# Patient Record
Sex: Female | Born: 1935 | Race: White | Hispanic: No | Smoking: Never smoker
Health system: Southern US, Community
[De-identification: ages and names within clinical notes are randomized; demographics above are authoritative.]

## PROBLEM LIST (undated history)

## (undated) DIAGNOSIS — K219 Gastro-esophageal reflux disease without esophagitis: Secondary | ICD-10-CM

## (undated) DIAGNOSIS — IMO0002 Reserved for concepts with insufficient information to code with codable children: Secondary | ICD-10-CM

## (undated) DIAGNOSIS — T7840XA Allergy, unspecified, initial encounter: Secondary | ICD-10-CM

## (undated) DIAGNOSIS — Z87440 Personal history of urinary (tract) infections: Secondary | ICD-10-CM

## (undated) DIAGNOSIS — G4700 Insomnia, unspecified: Secondary | ICD-10-CM

## (undated) DIAGNOSIS — F329 Major depressive disorder, single episode, unspecified: Secondary | ICD-10-CM

## (undated) DIAGNOSIS — M1990 Unspecified osteoarthritis, unspecified site: Secondary | ICD-10-CM

## (undated) DIAGNOSIS — F32A Depression, unspecified: Secondary | ICD-10-CM

## (undated) DIAGNOSIS — J449 Chronic obstructive pulmonary disease, unspecified: Secondary | ICD-10-CM

## (undated) DIAGNOSIS — I10 Essential (primary) hypertension: Secondary | ICD-10-CM

## (undated) HISTORY — DX: Unspecified osteoarthritis, unspecified site: M19.90

## (undated) HISTORY — DX: Major depressive disorder, single episode, unspecified: F32.9

## (undated) HISTORY — DX: Gastro-esophageal reflux disease without esophagitis: K21.9

## (undated) HISTORY — DX: Insomnia, unspecified: G47.00

## (undated) HISTORY — DX: Depression, unspecified: F32.A

## (undated) HISTORY — DX: Personal history of urinary (tract) infections: Z87.440

## (undated) HISTORY — DX: Essential (primary) hypertension: I10

## (undated) HISTORY — DX: Reserved for concepts with insufficient information to code with codable children: IMO0002

## (undated) HISTORY — DX: Allergy, unspecified, initial encounter: T78.40XA

## (undated) HISTORY — DX: Chronic obstructive pulmonary disease, unspecified: J44.9

## (undated) HISTORY — PX: ABDOMINAL HYSTERECTOMY: SHX81

---

## 1998-01-17 ENCOUNTER — Ambulatory Visit (HOSPITAL_COMMUNITY): Admission: RE | Admit: 1998-01-17 | Discharge: 1998-01-17 | Payer: Self-pay | Admitting: Family Medicine

## 2000-03-22 ENCOUNTER — Encounter: Payer: Self-pay | Admitting: Family Medicine

## 2000-03-22 ENCOUNTER — Ambulatory Visit (HOSPITAL_COMMUNITY): Admission: RE | Admit: 2000-03-22 | Discharge: 2000-03-22 | Payer: Self-pay | Admitting: Family Medicine

## 2001-10-22 ENCOUNTER — Other Ambulatory Visit: Admission: RE | Admit: 2001-10-22 | Discharge: 2001-10-22 | Payer: Self-pay | Admitting: Obstetrics and Gynecology

## 2004-07-26 ENCOUNTER — Ambulatory Visit: Payer: Self-pay | Admitting: Internal Medicine

## 2004-09-12 ENCOUNTER — Ambulatory Visit: Payer: Self-pay | Admitting: Internal Medicine

## 2004-10-18 ENCOUNTER — Ambulatory Visit: Payer: Self-pay | Admitting: Internal Medicine

## 2004-11-17 ENCOUNTER — Ambulatory Visit: Payer: Self-pay

## 2004-11-24 ENCOUNTER — Ambulatory Visit: Admission: RE | Admit: 2004-11-24 | Discharge: 2004-11-24 | Payer: Self-pay | Admitting: Internal Medicine

## 2004-11-27 ENCOUNTER — Ambulatory Visit: Payer: Self-pay | Admitting: Internal Medicine

## 2005-01-26 ENCOUNTER — Ambulatory Visit: Payer: Self-pay | Admitting: Internal Medicine

## 2005-05-22 ENCOUNTER — Ambulatory Visit: Payer: Self-pay | Admitting: Internal Medicine

## 2005-07-09 ENCOUNTER — Ambulatory Visit: Payer: Self-pay | Admitting: Internal Medicine

## 2005-07-17 ENCOUNTER — Ambulatory Visit: Payer: Self-pay | Admitting: Internal Medicine

## 2005-09-17 ENCOUNTER — Ambulatory Visit: Payer: Self-pay | Admitting: Internal Medicine

## 2005-10-15 ENCOUNTER — Ambulatory Visit: Payer: Self-pay | Admitting: Family Medicine

## 2005-11-12 ENCOUNTER — Ambulatory Visit: Payer: Self-pay | Admitting: Family Medicine

## 2006-01-23 ENCOUNTER — Ambulatory Visit: Payer: Self-pay | Admitting: Family Medicine

## 2006-06-26 ENCOUNTER — Ambulatory Visit: Payer: Self-pay | Admitting: Internal Medicine

## 2006-07-04 ENCOUNTER — Ambulatory Visit: Payer: Self-pay | Admitting: Family Medicine

## 2006-09-09 ENCOUNTER — Ambulatory Visit: Payer: Self-pay | Admitting: Family Medicine

## 2006-11-19 ENCOUNTER — Ambulatory Visit: Payer: Self-pay | Admitting: Family Medicine

## 2007-05-26 ENCOUNTER — Encounter: Payer: Self-pay | Admitting: Family Medicine

## 2007-07-01 DIAGNOSIS — J309 Allergic rhinitis, unspecified: Secondary | ICD-10-CM | POA: Insufficient documentation

## 2007-07-01 DIAGNOSIS — K219 Gastro-esophageal reflux disease without esophagitis: Secondary | ICD-10-CM | POA: Insufficient documentation

## 2007-07-01 DIAGNOSIS — I10 Essential (primary) hypertension: Secondary | ICD-10-CM | POA: Insufficient documentation

## 2007-07-01 DIAGNOSIS — M1990 Unspecified osteoarthritis, unspecified site: Secondary | ICD-10-CM | POA: Insufficient documentation

## 2007-07-01 DIAGNOSIS — J449 Chronic obstructive pulmonary disease, unspecified: Secondary | ICD-10-CM | POA: Insufficient documentation

## 2007-07-04 ENCOUNTER — Ambulatory Visit: Payer: Self-pay | Admitting: Family Medicine

## 2007-07-04 DIAGNOSIS — N3090 Cystitis, unspecified without hematuria: Secondary | ICD-10-CM | POA: Insufficient documentation

## 2007-07-04 LAB — CONVERTED CEMR LAB
Bilirubin Urine: NEGATIVE
Glucose, Urine, Semiquant: NEGATIVE
Ketones, urine, test strip: NEGATIVE
Nitrite: NEGATIVE
Protein, U semiquant: NEGATIVE
Specific Gravity, Urine: 1.015
Urobilinogen, UA: 0.2
pH: 5

## 2007-08-04 ENCOUNTER — Ambulatory Visit: Payer: Self-pay | Admitting: Family Medicine

## 2007-08-04 DIAGNOSIS — F329 Major depressive disorder, single episode, unspecified: Secondary | ICD-10-CM

## 2007-09-03 ENCOUNTER — Telehealth: Payer: Self-pay | Admitting: Family Medicine

## 2007-11-06 ENCOUNTER — Telehealth: Payer: Self-pay | Admitting: Family Medicine

## 2007-11-24 ENCOUNTER — Telehealth: Payer: Self-pay | Admitting: Family Medicine

## 2008-05-27 ENCOUNTER — Encounter: Payer: Self-pay | Admitting: Family Medicine

## 2008-06-16 ENCOUNTER — Ambulatory Visit: Payer: Self-pay | Admitting: Family Medicine

## 2008-06-16 DIAGNOSIS — R059 Cough, unspecified: Secondary | ICD-10-CM | POA: Insufficient documentation

## 2008-06-16 DIAGNOSIS — R05 Cough: Secondary | ICD-10-CM

## 2008-06-17 LAB — CONVERTED CEMR LAB
ALT: 18 units/L (ref 0–35)
AST: 19 units/L (ref 0–37)
Albumin: 3.7 g/dL (ref 3.5–5.2)
Alkaline Phosphatase: 53 units/L (ref 39–117)
BUN: 17 mg/dL (ref 6–23)
Basophils Absolute: 0.1 10*3/uL (ref 0.0–0.1)
Basophils Relative: 0.7 % (ref 0.0–3.0)
Bilirubin, Direct: 0.1 mg/dL (ref 0.0–0.3)
CO2: 31 meq/L (ref 19–32)
Calcium: 9.3 mg/dL (ref 8.4–10.5)
Chloride: 103 meq/L (ref 96–112)
Creatinine, Ser: 0.8 mg/dL (ref 0.4–1.2)
Eosinophils Absolute: 0.2 10*3/uL (ref 0.0–0.7)
Eosinophils Relative: 3.3 % (ref 0.0–5.0)
GFR calc Af Amer: 90 mL/min
GFR calc non Af Amer: 75 mL/min
Glucose, Bld: 117 mg/dL — ABNORMAL HIGH (ref 70–99)
HCT: 40.3 % (ref 36.0–46.0)
Hemoglobin: 13.8 g/dL (ref 12.0–15.0)
Lymphocytes Relative: 17.8 % (ref 12.0–46.0)
MCHC: 34.2 g/dL (ref 30.0–36.0)
MCV: 85.2 fL (ref 78.0–100.0)
Monocytes Absolute: 0.5 10*3/uL (ref 0.1–1.0)
Monocytes Relative: 7.5 % (ref 3.0–12.0)
Neutro Abs: 5.1 10*3/uL (ref 1.4–7.7)
Neutrophils Relative %: 70.7 % (ref 43.0–77.0)
Platelets: 221 10*3/uL (ref 150–400)
Potassium: 3.8 meq/L (ref 3.5–5.1)
RBC: 4.73 M/uL (ref 3.87–5.11)
RDW: 13.4 % (ref 11.5–14.6)
Sodium: 140 meq/L (ref 135–145)
TSH: 0.75 microintl units/mL (ref 0.35–5.50)
Total Bilirubin: 0.6 mg/dL (ref 0.3–1.2)
Total Protein: 6.7 g/dL (ref 6.0–8.3)
WBC: 7.2 10*3/uL (ref 4.5–10.5)

## 2008-06-29 ENCOUNTER — Ambulatory Visit: Payer: Self-pay | Admitting: Family Medicine

## 2008-06-29 DIAGNOSIS — R32 Unspecified urinary incontinence: Secondary | ICD-10-CM | POA: Insufficient documentation

## 2008-06-29 LAB — CONVERTED CEMR LAB
Bilirubin Urine: NEGATIVE
Glucose, Urine, Semiquant: NEGATIVE
Ketones, urine, test strip: NEGATIVE
Nitrite: NEGATIVE
Protein, U semiquant: NEGATIVE
Specific Gravity, Urine: <1.005
Urobilinogen, UA: 0.2
pH: 5

## 2008-07-07 ENCOUNTER — Encounter: Payer: Self-pay | Admitting: Family Medicine

## 2008-07-07 HISTORY — PX: COLONOSCOPY: SHX174

## 2008-10-15 ENCOUNTER — Ambulatory Visit: Payer: Self-pay | Admitting: Family Medicine

## 2008-10-15 LAB — CONVERTED CEMR LAB
Bilirubin Urine: NEGATIVE
Glucose, Urine, Semiquant: NEGATIVE
Ketones, urine, test strip: NEGATIVE
Nitrite: POSITIVE
Protein, U semiquant: NEGATIVE
Specific Gravity, Urine: <1.005
Urobilinogen, UA: 0.2
pH: 5.5

## 2008-11-10 ENCOUNTER — Encounter: Payer: Self-pay | Admitting: Family Medicine

## 2008-11-16 ENCOUNTER — Encounter: Payer: Self-pay | Admitting: Family Medicine

## 2008-12-21 ENCOUNTER — Encounter: Payer: Self-pay | Admitting: Family Medicine

## 2008-12-21 HISTORY — PX: CYSTOSCOPY: SUR368

## 2008-12-23 ENCOUNTER — Encounter (INDEPENDENT_AMBULATORY_CARE_PROVIDER_SITE_OTHER): Payer: Self-pay | Admitting: *Deleted

## 2008-12-27 ENCOUNTER — Telehealth: Payer: Self-pay | Admitting: Family Medicine

## 2009-08-11 ENCOUNTER — Ambulatory Visit: Payer: Self-pay | Admitting: Family Medicine

## 2009-08-11 DIAGNOSIS — G4700 Insomnia, unspecified: Secondary | ICD-10-CM | POA: Insufficient documentation

## 2009-08-12 ENCOUNTER — Encounter: Payer: Self-pay | Admitting: Family Medicine

## 2010-03-13 ENCOUNTER — Telehealth: Payer: Self-pay | Admitting: Family Medicine

## 2010-04-21 ENCOUNTER — Encounter: Payer: Self-pay | Admitting: Family Medicine

## 2010-06-14 ENCOUNTER — Encounter: Payer: Self-pay | Admitting: Family Medicine

## 2010-06-19 ENCOUNTER — Ambulatory Visit: Payer: Self-pay | Admitting: Family Medicine

## 2010-06-19 DIAGNOSIS — L259 Unspecified contact dermatitis, unspecified cause: Secondary | ICD-10-CM | POA: Insufficient documentation

## 2010-06-19 DIAGNOSIS — B029 Zoster without complications: Secondary | ICD-10-CM | POA: Insufficient documentation

## 2010-08-21 ENCOUNTER — Ambulatory Visit: Payer: Self-pay | Admitting: Family Medicine

## 2010-08-21 DIAGNOSIS — M4120 Other idiopathic scoliosis, site unspecified: Secondary | ICD-10-CM | POA: Insufficient documentation

## 2010-09-20 ENCOUNTER — Telehealth: Payer: Self-pay | Admitting: Family Medicine

## 2010-09-25 ENCOUNTER — Telehealth: Payer: Self-pay | Admitting: Family Medicine

## 2010-10-01 LAB — CONVERTED CEMR LAB
ALT: 15 units/L (ref 0–35)
ALT: 19 units/L (ref 0–35)
AST: 20 units/L (ref 0–37)
AST: 20 units/L (ref 0–37)
Albumin: 4 g/dL (ref 3.5–5.2)
Albumin: 4.1 g/dL (ref 3.5–5.2)
Alkaline Phosphatase: 49 units/L (ref 39–117)
Alkaline Phosphatase: 49 units/L (ref 39–117)
BUN: 15 mg/dL (ref 6–23)
BUN: 18 mg/dL (ref 6–23)
Basophils Absolute: 0 10*3/uL (ref 0.0–0.1)
Basophils Absolute: 0 10*3/uL (ref 0.0–0.1)
Basophils Relative: 0.1 % (ref 0.0–1.0)
Basophils Relative: 0.1 % (ref 0.0–3.0)
Bilirubin, Direct: 0 mg/dL (ref 0.0–0.3)
Bilirubin, Direct: 0.2 mg/dL (ref 0.0–0.3)
CO2: 29 meq/L (ref 19–32)
CO2: 31 meq/L (ref 19–32)
Calcium: 9.3 mg/dL (ref 8.4–10.5)
Calcium: 9.6 mg/dL (ref 8.4–10.5)
Chloride: 102 meq/L (ref 96–112)
Chloride: 102 meq/L (ref 96–112)
Cholesterol: 237 mg/dL — ABNORMAL HIGH (ref 0–200)
Cholesterol: 271 mg/dL (ref 0–200)
Creatinine, Ser: 0.9 mg/dL (ref 0.4–1.2)
Creatinine, Ser: 0.9 mg/dL (ref 0.4–1.2)
Direct LDL: 167.2 mg/dL
Direct LDL: 207.5 mg/dL
Eosinophils Absolute: 0.2 10*3/uL (ref 0.0–0.6)
Eosinophils Absolute: 0.2 10*3/uL (ref 0.0–0.7)
Eosinophils Relative: 3.4 % (ref 0.0–5.0)
Eosinophils Relative: 3.7 % (ref 0.0–5.0)
GFR calc Af Amer: 79 mL/min
GFR calc non Af Amer: 64.93 mL/min (ref >60)
GFR calc non Af Amer: 65 mL/min
Glucose, Bld: 102 mg/dL — ABNORMAL HIGH (ref 70–99)
Glucose, Bld: 114 mg/dL — ABNORMAL HIGH (ref 70–99)
HCT: 41.4 % (ref 36.0–46.0)
HCT: 42.5 % (ref 36.0–46.0)
HDL: 35.6 mg/dL — ABNORMAL LOW (ref >39.00)
HDL: 41.4 mg/dL (ref >39.0)
Hemoglobin: 14.1 g/dL (ref 12.0–15.0)
Hemoglobin: 14.4 g/dL (ref 12.0–15.0)
Lymphocytes Relative: 15.2 % (ref 12.0–46.0)
Lymphocytes Relative: 20.7 % (ref 12.0–46.0)
Lymphs Abs: 1 10*3/uL (ref 0.7–4.0)
MCHC: 33.1 g/dL (ref 30.0–36.0)
MCHC: 34.8 g/dL (ref 30.0–36.0)
MCV: 85.7 fL (ref 78.0–100.0)
MCV: 87.2 fL (ref 78.0–100.0)
Monocytes Absolute: 0.5 10*3/uL (ref 0.1–1.0)
Monocytes Absolute: 0.5 10*3/uL (ref 0.2–0.7)
Monocytes Relative: 7.7 % (ref 3.0–12.0)
Monocytes Relative: 9.1 % (ref 3.0–11.0)
Neutro Abs: 3.7 10*3/uL (ref 1.4–7.7)
Neutro Abs: 5 10*3/uL (ref 1.4–7.7)
Neutrophils Relative %: 66.7 % (ref 43.0–77.0)
Neutrophils Relative %: 73.3 % (ref 43.0–77.0)
Platelets: 235 10*3/uL (ref 150.0–400.0)
Platelets: 252 10*3/uL (ref 150–400)
Potassium: 3.9 meq/L (ref 3.5–5.1)
Potassium: 4.2 meq/L (ref 3.5–5.1)
RBC: 4.83 M/uL (ref 3.87–5.11)
RBC: 4.87 M/uL (ref 3.87–5.11)
RDW: 12.9 % (ref 11.5–14.6)
RDW: 13.2 % (ref 11.5–14.6)
Sodium: 141 meq/L (ref 135–145)
Sodium: 142 meq/L (ref 135–145)
TSH: 0.83 microintl units/mL (ref 0.35–5.50)
TSH: 3.46 microintl units/mL (ref 0.35–5.50)
Total Bilirubin: 0.8 mg/dL (ref 0.3–1.2)
Total Bilirubin: 0.9 mg/dL (ref 0.3–1.2)
Total CHOL/HDL Ratio: 6.5
Total CHOL/HDL Ratio: 7
Total Protein: 6.6 g/dL (ref 6.0–8.3)
Total Protein: 7.1 g/dL (ref 6.0–8.3)
Triglycerides: 147 mg/dL (ref 0–149)
Triglycerides: 190 mg/dL — ABNORMAL HIGH (ref 0.0–149.0)
VLDL: 29 mg/dL (ref 0–40)
VLDL: 38 mg/dL (ref 0.0–40.0)
WBC: 5.6 10*3/uL (ref 4.5–10.5)
WBC: 6.7 10*3/uL (ref 4.5–10.5)

## 2010-10-02 ENCOUNTER — Telehealth: Payer: Self-pay | Admitting: Family Medicine

## 2010-10-05 ENCOUNTER — Telehealth: Payer: Self-pay | Admitting: *Deleted

## 2010-10-05 NOTE — Letter (Signed)
Summary: [facility]  [facility]   Imported By: Maryln Gottron 05/01/2010 14:06:27  _____________________________________________________________________  External Attachment:    Type:   Image     Comment:   External Document

## 2010-10-05 NOTE — Progress Notes (Signed)
Summary: rx amlopidine   Phone Note From Pharmacy   Caller: [facility]* Summary of Call: refill amlodipine 5mg    Initial call taken by: Pura Spice, RN,  September 25, 2010 4:09 PM  Follow-up for Phone Call        faxed Follow-up by: Pura Spice, RN,  September 25, 2010 4:09 PM    New/Updated Medications: AMLODIPINE BESYLATE 5 MG TABS (AMLODIPINE BESYLATE) once daily needs to be seen Prescriptions: AMLODIPINE BESYLATE 5 MG TABS (AMLODIPINE BESYLATE) once daily needs to be seen  #30 x 2   Entered by:   Pura Spice, RN   Authorized by:   Nelwyn Salisbury MD   Signed by:   Pura Spice, RN on 09/25/2010   Method used:   Electronically to        [facility]* (retail)       [address] [place], [place]  [address]       Ph: [phone] or [phone]       Fax: [phone]   RxID:   [id number]

## 2010-10-05 NOTE — Progress Notes (Signed)
Summary: Pt req Lyrica to be changed to Gabapentin per pharmacist  Phone Note Call from Patient Call back at Home Phone [phone]   Caller: Patient Summary of Call: Pt said that her med needs to be changed from Lyrica to Gabapentin. Pt needs this done today asap.   Pt is req to be called asap today. Pls call in to [facility]. Pt is out of med.  Initial call taken by: Lucy Antigua,  September 20, 2010 11:55 AM  Follow-up for Phone Call        okay. stop Lyrica and call in Gabapentin 100 mg two times a day , #60 with 2 rf  Follow-up by: Nelwyn Salisbury MD,  September 20, 2010 5:22 PM  Additional Follow-up for Phone Call Additional follow up Details #1::        pt notified  med  called in  Additional Follow-up by: Pura Spice, RN,  September 21, 2010 8:36 AM    New/Updated Medications: GABAPENTIN 100 MG CAPS (GABAPENTIN) 1 by mouth two times a day Prescriptions: GABAPENTIN 100 MG CAPS (GABAPENTIN) 1 by mouth two times a day  #60 x 2   Entered by:   Pura Spice, RN   Authorized by:   Nelwyn Salisbury MD   Signed by:   Pura Spice, RN on 09/21/2010   Method used:   Telephoned to ...       [facility]* (retail)       [address] [place], [place]  [address]       Ph: [phone] or [phone]       Fax: [phone]   RxID:   [id number]

## 2010-10-05 NOTE — Assessment & Plan Note (Signed)
Summary: body pains/ccm   Vital Signs:  Patient profile:   77 year old female Weight:      200 pounds O2 Sat:      95 % Temp:     97.5 degrees F BP sitting:   150 / 86  (left arm) Cuff size:   large  Vitals Entered By: Pura Spice, RN (August 21, 2010 3:34 PM) CC: c/o burning sensation shoulders to lower back stated lyrica has helped the leg pain   History of Present Illness: Here to discuss low back pain. This has been getting worse for several months. Tylenol helps a bit. It is worse hen lying in bed at night. Also she has been depressed for several months. She has sadness, tearfulness, loss of energy, and insomnia. On Ambien.   Allergies (verified): No Known Drug Allergies  Past History:  Past Medical History: Reviewed history from 08/11/2009 and no changes required. Hemorrhoids Allergic rhinitis COPD GERD Hypertension Osteoarthritis Depression insomnia microscopic hematuria cystocele frequent UTIs  Review of Systems  The patient denies anorexia, fever, weight loss, weight gain, decreased hearing, hoarseness, chest pain, syncope, dyspnea on exertion, peripheral edema, prolonged cough, headaches, hemoptysis, abdominal pain, melena, hematochezia, severe indigestion/heartburn, hematuria, incontinence, genital sores, muscle weakness, suspicious skin lesions, transient blindness, difficulty walking, unusual weight change, abnormal bleeding, enlarged lymph nodes, angioedema, breast masses, and testicular masses.    Physical Exam  General:  Well-developed,well-nourished,in no acute distress; alert,appropriate and cooperative throughout examination Msk:  her lower spine has some scoliosis. It is tender and has decreased ROM  Psych:  Oriented X3, memory intact for recent and remote, normally interactive, good eye contact, depressed affect, and tearful.     Impression & Recommendations:  Problem # 1:  SCOLIOSIS (ICD-737.30)  Problem # 2:  OSTEOARTHRITIS  (ICD-715.90)  Her updated medication list for this problem includes:    Aspirin 81 Mg Tabs (Aspirin) ..... Once daily    Vicodin 5-500 Mg Tabs (Hydrocodone-acetaminophen) .Marland Kitchen... 1 or 2 at bedtime as needed for pain  Problem # 3:  DEPRESSION (ICD-311)  Her updated medication list for this problem includes:    Zoloft 50 Mg Tabs (Sertraline hcl) ..... Once daily  Complete Medication List: 1)  Aspirin 81 Mg Tabs (Aspirin) .... Once daily 2)  Hydrochlorothiazide 12.5 Mg Tabs (Hydrochlorothiazide) .... Once daily 3)  Amlodipine Besylate 5 Mg Tabs (Amlodipine besylate) .... Once daily 4)  Vesicare 10 Mg Tabs (Solifenacin succinate) .... Once daily 5)  Dexilant 60 Mg Cpdr (Dexlansoprazole) .... Once daily 6)  Ambien 10 Mg Tabs (Zolpidem tartrate) .Marland Kitchen.. 1 at bedtime 7)  Lyrica 50 Mg Caps (Pregabalin) .... Two times a day 8)  Triamcinolone Acetonide 0.5 % Crea (Triamcinolone acetonide) .... Apply two times a day as needed 9)  Zoloft 50 Mg Tabs (Sertraline hcl) .... Once daily 10)  Vicodin 5-500 Mg Tabs (Hydrocodone-acetaminophen) .Marland Kitchen.. 1 or 2 at bedtime as needed for pain  Patient Instructions: 1)  Please schedule a follow-up appointment in 1 month.  Prescriptions: VICODIN 5-500 MG TABS (HYDROCODONE-ACETAMINOPHEN) 1 or 2 at bedtime as needed for pain  #60 x 2   Entered and Authorized by:   Nelwyn Salisbury MD   Signed by:   Nelwyn Salisbury MD on 08/21/2010   Method used:   Print then Give to Patient   RxID:   [id number] ZOLOFT 50 MG TABS (SERTRALINE HCL) once daily  #30 x 2   Entered and Authorized by:   Nelwyn Salisbury MD  Signed by:   Nelwyn Salisbury MD on 08/21/2010   Method used:   Print then Give to Patient   RxID:   [id number]    Orders Added: 1)  Est. Patient Level IV [401[address]]

## 2010-10-05 NOTE — Assessment & Plan Note (Signed)
Summary: LEG PAIN/NJR   Vital Signs:  Patient profile:   77 year old female Weight:      207 pounds O2 Sat:      96 % Temp:     98.4 degrees F Pulse rate:   64 / minute BP sitting:   160 / 100  (left arm) Cuff size:   large  Vitals Entered By: Pura Spice, RN (June 19, 2010 11:42 AM) CC: left leg pain saw dr Margaretha Sheffield Gwenlyn Perking had mri . Rash noted anterior surface area and also states same is on left breast. was told it was shingles.    History of Present Illness: Here for what seem to be 2 distinct problems. First she developed a blistering rash over the lower left leg about 3 months ago, and also developed a burning pain from the left hip down to the foot. This persisted for several weeks, until she saw Dr. Margaretha Sheffield for an orthopedic visit. He thought she may have had shingles, and he put her on a prednisone pack. The rash eventually went away but the burning has persisted. Then about 2 weeks ago she developed a different kind of rash which is itchy on the left lower leg, but she has also had similar rashes on the arms and trunk.   Allergies (verified): No Known Drug Allergies  Past History:  Past Medical History: Reviewed history from 08/11/2009 and no changes required. Hemorrhoids Allergic rhinitis COPD GERD Hypertension Osteoarthritis Depression insomnia microscopic hematuria cystocele frequent UTIs  Review of Systems  The patient denies anorexia, fever, weight loss, weight gain, vision loss, decreased hearing, hoarseness, chest pain, syncope, dyspnea on exertion, peripheral edema, prolonged cough, headaches, hemoptysis, abdominal pain, melena, hematochezia, severe indigestion/heartburn, hematuria, incontinence, genital sores, muscle weakness, suspicious skin lesions, transient blindness, difficulty walking, depression, unusual weight change, abnormal bleeding, enlarged lymph nodes, angioedema, breast masses, and testicular masses.    Physical Exam  General:   Well-developed,well-nourished,in no acute distress; alert,appropriate and cooperative throughout examination Lungs:  Normal respiratory effort, chest expands symmetrically. Lungs are clear to auscultation, no crackles or wheezes. Heart:  Normal rate and regular rhythm. S1 and S2 normal without gallop, murmur, click, rub or other extra sounds. Skin:  widespread areas of macular dry scaly erythematous skin   Impression & Recommendations:  Problem # 1:  SHINGLES (ICD-053.9)  Problem # 2:  ECZEMA (ICD-692.9)  Her updated medication list for this problem includes:    Triamcinolone Acetonide 0.5 % Crea (Triamcinolone acetonide) .Marland Kitchen... Apply two times a day as needed  Complete Medication List: 1)  Aspirin 81 Mg Tabs (Aspirin) .... Once daily 2)  Hydrochlorothiazide 12.5 Mg Tabs (Hydrochlorothiazide) .... Once daily 3)  Amlodipine Besylate 5 Mg Tabs (Amlodipine besylate) .... Once daily 4)  Vesicare 10 Mg Tabs (Solifenacin succinate) .... Once daily 5)  Dexilant 60 Mg Cpdr (Dexlansoprazole) .... Once daily 6)  Ambien 10 Mg Tabs (Zolpidem tartrate) .Marland Kitchen.. 1 at bedtime 7)  Lyrica 50 Mg Caps (Pregabalin) .... Two times a day 8)  Triamcinolone Acetonide 0.5 % Crea (Triamcinolone acetonide) .... Apply two times a day as needed  Patient Instructions: 1)  The herpetic rash has resolved, and now she is showing eczematous rashes. Use Triamcinolone cream for this. She has postherpetic neuralgia in the leg, so we will try Lyrica for this.  2)  Please schedule a follow-up appointment in 2 weeks.  Prescriptions: TRIAMCINOLONE ACETONIDE 0.5 % CREA (TRIAMCINOLONE ACETONIDE) apply two times a day as needed  #60 x 2  Entered and Authorized by:   Nelwyn Salisbury MD   Signed by:   Nelwyn Salisbury MD on 06/19/2010   Method used:   Print then Give to Patient   RxID:   [id number] LYRICA 50 MG CAPS (PREGABALIN) two times a day  #60 x 5   Entered and Authorized by:   Nelwyn Salisbury MD   Signed by:   Nelwyn Salisbury  MD on 06/19/2010   Method used:   Print then Give to Patient   RxID:   [id number]    Orders Added: 1)  Est. Patient Level IV [84696]

## 2010-10-05 NOTE — Progress Notes (Signed)
Summary: refill ambien  Phone Note Refill Request Message from:  Fax from Pharmacy on March 13, 2010 3:55 PM  Refills Requested: Medication #1:  ambien 10mg    Supply Requested: 1 month   Last Refilled: 03/30/2009  Method Requested: Fax to Local Pharmacy Initial call taken by: Raechel Ache, RN,  March 13, 2010 3:56 PM Caller: [facility]*  Follow-up for Phone Call        call in #30 with 5 rf Follow-up by: Nelwyn Salisbury MD,  March 14, 2010 5:46 PM  Additional Follow-up for Phone Call Additional follow up Details #1::        Rx faxed to pharmacy Additional Follow-up by: Raechel Ache, RN,  March 15, 2010 8:33 AM    New/Updated Medications: AMBIEN 10 MG TABS (ZOLPIDEM TARTRATE) 1 at bedtime Prescriptions: AMBIEN 10 MG TABS (ZOLPIDEM TARTRATE) 1 at bedtime  #30 x 5   Entered by:   Raechel Ache, RN   Authorized by:   Nelwyn Salisbury MD   Signed by:   Raechel Ache, RN on 03/15/2010   Method used:   Historical   RxID:   [id number]

## 2010-10-05 NOTE — Telephone Encounter (Signed)
Headaches x 2 weeks since starting Zoloft and Lyrica.  Suggestions?

## 2010-10-06 NOTE — Telephone Encounter (Signed)
Pt aware to stop lyrica and will call back

## 2010-10-06 NOTE — Telephone Encounter (Signed)
Continue Zoloft but stop Lyrica. Let us know how she is doing in a few weeks

## 2010-10-11 NOTE — Progress Notes (Signed)
Summary: refill hctz  Phone Note From Pharmacy   Caller: [facility]* Summary of Call: refill hctz  Initial call taken by: Pura Spice, RN,  October 02, 2010 5:15 PM  Follow-up for Phone Call        done  Follow-up by: Pura Spice, RN,  October 02, 2010 5:15 PM    New/Updated Medications: HYDROCHLOROTHIAZIDE 12.5 MG TABS (HYDROCHLOROTHIAZIDE) once daily Prescriptions: HYDROCHLOROTHIAZIDE 12.5 MG TABS (HYDROCHLOROTHIAZIDE) once daily  #30 x 6   Entered by:   Pura Spice, RN   Authorized by:   Nelwyn Salisbury MD   Signed by:   Pura Spice, RN on 10/02/2010   Method used:   Electronically to        [facility]* (retail)       [address] [place], [place]  [address]       Ph: [phone] or [phone]       Fax: [phone]   RxID:   [id number]

## 2010-10-23 ENCOUNTER — Encounter: Payer: Self-pay | Admitting: Family Medicine

## 2010-10-23 ENCOUNTER — Ambulatory Visit (INDEPENDENT_AMBULATORY_CARE_PROVIDER_SITE_OTHER): Payer: Medicare Other | Admitting: Family Medicine

## 2010-10-23 VITALS — BP 140/90 | HR 62 | Temp 97.7°F | Wt 189.0 lb

## 2010-10-23 DIAGNOSIS — N390 Urinary tract infection, site not specified: Secondary | ICD-10-CM

## 2010-10-23 DIAGNOSIS — G4700 Insomnia, unspecified: Secondary | ICD-10-CM

## 2010-10-23 DIAGNOSIS — M545 Low back pain, unspecified: Secondary | ICD-10-CM

## 2010-10-23 DIAGNOSIS — F32A Depression, unspecified: Secondary | ICD-10-CM

## 2010-10-23 DIAGNOSIS — I10 Essential (primary) hypertension: Secondary | ICD-10-CM

## 2010-10-23 DIAGNOSIS — F329 Major depressive disorder, single episode, unspecified: Secondary | ICD-10-CM

## 2010-10-23 LAB — POCT URINALYSIS DIPSTICK
Bilirubin, UA: NEGATIVE
Ketones, UA: NEGATIVE
Protein, UA: NEGATIVE
Spec Grav, UA: 1.01
pH, UA: 5

## 2010-10-23 MED ORDER — ZOLPIDEM TARTRATE 10 MG PO TABS: 10.0000 mg | ORAL_TABLET | Freq: Every evening | ORAL | Status: DC | PRN

## 2010-10-23 MED ORDER — SULFAMETHOXAZOLE-TRIMETHOPRIM 800-160 MG PO TABS: 1.0000 | ORAL_TABLET | Freq: Two times a day (BID) | ORAL | Status: AC

## 2010-10-23 MED ORDER — SERTRALINE HCL 50 MG PO TABS: 50.0000 mg | ORAL_TABLET | Freq: Every day | ORAL | Status: DC

## 2010-10-23 MED ORDER — HYDROCHLOROTHIAZIDE 12.5 MG PO CAPS: 12.5000 mg | ORAL_CAPSULE | Freq: Every day | ORAL | Status: DC

## 2010-10-23 MED ORDER — AMLODIPINE BESYLATE 5 MG PO TABS: 5.0000 mg | ORAL_TABLET | Freq: Every day | ORAL | Status: DC

## 2010-10-23 NOTE — Progress Notes (Signed)
  Subjective:    Patient ID: Stacey Melton, female    DOB: 03/31/1933, 77 y.o.   MRN: [id number]  HPI Here for what she thinks is a urine infection and also for refills. 5 days ago she developed some low back pains and then noticed an urge to urinate with difficulty in starting the urine stream. No burning, no nausea, no fever. She drinks a lot of water every day. Her BP has been stable. She is watching her diet and has lost 30 lbs in the past year. Her depression has its ups and downs, but in general she is doing all right. She still misses her husband quite a bit.    Review of Systems  Constitutional: Negative.   Respiratory: Negative.   Cardiovascular: Negative.   Gastrointestinal: Negative.   Genitourinary: Positive for difficulty urinating. Negative for dysuria, frequency and pelvic pain.  Psychiatric/Behavioral: Positive for dysphoric mood. Negative for sleep disturbance. The patient is not nervous/anxious.        Objective:   Physical Exam  Constitutional: She appears well-developed and well-nourished.  Pulmonary/Chest: Effort normal and breath sounds normal. No respiratory distress. She has no wheezes. She has no rales. She exhibits no tenderness.  Abdominal: Soft. Bowel sounds are normal. She exhibits no distension and no mass. There is no tenderness. There is no rebound and no guarding.  Psychiatric: She has a normal mood and affect. Her behavior is normal. Judgment and thought content normal.          Assessment & Plan:  We will treat her UTI. Increase the Zoloft to a full 50 mg a day.

## 2011-01-19 NOTE — Assessment & Plan Note (Signed)
[facility]                                 ON-CALL NOTE   NAME:Stacey Melton, Stacey Melton                    MRN:          [id number]  DATE:07/05/2007                            DOB:          06-03-1933    TIME:  12:53 p.m.   PHONE NUMBER:  [phone].   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  Wanted to clarify a  prescription for ciprofloxacin Dr. Clent Ridges wrote for Cipro 100 mg for a UTI,  there is no such dose.  Was told to make it 250 mg b.i.d. for 7 days, 14  and no refills.   PRIMARY CARE Stacey Melton:  Dr. Clent Ridges.   HOME OFFICE:  [place].     Arta Silence, MD  Electronically Signed    RNS/MedQ  DD: 07/05/2007  DT: 07/06/2007  Job #: [id number]

## 2011-08-07 ENCOUNTER — Ambulatory Visit (INDEPENDENT_AMBULATORY_CARE_PROVIDER_SITE_OTHER): Payer: Medicare Other | Admitting: Family Medicine

## 2011-08-07 DIAGNOSIS — Z23 Encounter for immunization: Secondary | ICD-10-CM

## 2011-11-05 ENCOUNTER — Telehealth: Payer: Self-pay | Admitting: Family Medicine

## 2011-11-05 NOTE — Telephone Encounter (Signed)
Pt has a cyst on her rt wrist that pt says needs to be drained. Pt wants to know it Dr Clent Ridges can remove it in office? Pt didn't want to sch ov, unless Dr Clent Ridges is definate that he can drain it in office.

## 2011-11-05 NOTE — Telephone Encounter (Signed)
How in the world am I supposed to answer this question sight unseen? Of course I would need to see it first

## 2011-11-05 NOTE — Telephone Encounter (Signed)
Please see below note

## 2011-11-06 NOTE — Telephone Encounter (Signed)
Explained to pt that she needs to be seen in order to verify if it is a ganglion cyst.  Pt states she understands and made an appt for 11/07/11.

## 2011-11-07 ENCOUNTER — Encounter: Payer: Self-pay | Admitting: Family Medicine

## 2011-11-07 ENCOUNTER — Ambulatory Visit (INDEPENDENT_AMBULATORY_CARE_PROVIDER_SITE_OTHER): Payer: Medicare Other | Admitting: Family Medicine

## 2011-11-07 VITALS — BP 132/88 | HR 62 | Temp 98.8°F | Wt 187.0 lb

## 2011-11-07 DIAGNOSIS — M6740 Ganglion, unspecified site: Secondary | ICD-10-CM

## 2011-11-07 DIAGNOSIS — I10 Essential (primary) hypertension: Secondary | ICD-10-CM

## 2011-11-07 DIAGNOSIS — M67439 Ganglion, unspecified wrist: Secondary | ICD-10-CM

## 2011-11-07 NOTE — Progress Notes (Signed)
  Subjective:    Patient ID: Stacey Melton, female    DOB: April 09, 1933, 78 y.o.   MRN: [id number]  HPI Here for 2 months of a non-painful lump on the right wrist. It does not bother her at all. She has not had a cpx for several years.   Review of Systems  Constitutional: Negative.   Respiratory: Negative.   Cardiovascular: Negative.        Objective:   Physical Exam  Constitutional: She appears well-developed and well-nourished.  Cardiovascular: Normal rate, regular rhythm, normal heart sounds and intact distal pulses.   Pulmonary/Chest: Effort normal and breath sounds normal.  Musculoskeletal:       The right wrist has a firm nodular non-tender mass on the flexor surface. ROM is normal           Assessment & Plan:  She has a ganglion cyst. It probably needs a surgical excision, but since it is not symptomatic at this point she decided to simply observe it. Her HTN is stable. She will set up a cpx with labs soon.

## 2011-11-08 ENCOUNTER — Other Ambulatory Visit: Payer: Self-pay

## 2011-11-08 NOTE — Telephone Encounter (Signed)
Rx request for sertraline hcl 50 mg.  Take 1 tablet by mouth once daily.  Pt last seen 11/07/11. Pls advise.

## 2011-11-09 MED ORDER — SERTRALINE HCL 50 MG PO TABS: 50.0000 mg | ORAL_TABLET | Freq: Every day | ORAL | Status: DC

## 2011-11-09 NOTE — Telephone Encounter (Signed)
Refill for one year 

## 2011-11-15 ENCOUNTER — Telehealth: Payer: Self-pay | Admitting: Family Medicine

## 2011-11-15 MED ORDER — AMLODIPINE BESYLATE 5 MG PO TABS: 5.0000 mg | ORAL_TABLET | Freq: Every day | ORAL | Status: DC

## 2011-11-15 MED ORDER — HYDROCHLOROTHIAZIDE 12.5 MG PO TABS: 12.5000 mg | ORAL_TABLET | Freq: Every day | ORAL | Status: DC

## 2011-11-15 NOTE — Telephone Encounter (Signed)
Script sent e-scribe 

## 2011-11-26 ENCOUNTER — Encounter: Payer: Medicare Other | Admitting: Family Medicine

## 2011-12-25 ENCOUNTER — Encounter: Payer: Medicare Other | Admitting: Family Medicine

## 2012-01-29 ENCOUNTER — Ambulatory Visit (INDEPENDENT_AMBULATORY_CARE_PROVIDER_SITE_OTHER): Payer: Medicare Other | Admitting: Family Medicine

## 2012-01-29 ENCOUNTER — Encounter: Payer: Self-pay | Admitting: Family Medicine

## 2012-01-29 VITALS — BP 120/76 | HR 59 | Temp 98.9°F | Ht 63.0 in | Wt 188.0 lb

## 2012-01-29 DIAGNOSIS — K219 Gastro-esophageal reflux disease without esophagitis: Secondary | ICD-10-CM

## 2012-01-29 DIAGNOSIS — M1990 Unspecified osteoarthritis, unspecified site: Secondary | ICD-10-CM

## 2012-01-29 DIAGNOSIS — I10 Essential (primary) hypertension: Secondary | ICD-10-CM

## 2012-01-29 DIAGNOSIS — G4700 Insomnia, unspecified: Secondary | ICD-10-CM

## 2012-01-29 DIAGNOSIS — F32A Depression, unspecified: Secondary | ICD-10-CM

## 2012-01-29 DIAGNOSIS — F329 Major depressive disorder, single episode, unspecified: Secondary | ICD-10-CM

## 2012-01-29 LAB — CBC WITH DIFFERENTIAL/PLATELET
Eosinophils Relative: 2.8 % (ref 0.0–5.0)
HCT: 41.9 % (ref 36.0–46.0)
Hemoglobin: 13.8 g/dL (ref 12.0–15.0)
Lymphocytes Relative: 22.8 % (ref 12.0–46.0)
Lymphs Abs: 1.1 10*3/uL (ref 0.7–4.0)
Monocytes Relative: 8.9 % (ref 3.0–12.0)
Neutro Abs: 3 10*3/uL (ref 1.4–7.7)
RBC: 4.75 Mil/uL (ref 3.87–5.11)
WBC: 4.7 10*3/uL (ref 4.5–10.5)

## 2012-01-29 LAB — TSH: TSH: 0.84 u[IU]/mL (ref 0.35–5.50)

## 2012-01-29 LAB — POCT URINALYSIS DIPSTICK
Bilirubin, UA: NEGATIVE
Glucose, UA: NEGATIVE
Ketones, UA: NEGATIVE
Protein, UA: NEGATIVE
Spec Grav, UA: 1.02

## 2012-01-29 LAB — BASIC METABOLIC PANEL
CO2: 27 mEq/L (ref 19–32)
Chloride: 104 mEq/L (ref 96–112)
Glucose, Bld: 91 mg/dL (ref 70–99)
Potassium: 4.5 mEq/L (ref 3.5–5.1)
Sodium: 140 mEq/L (ref 135–145)

## 2012-01-29 LAB — HEPATIC FUNCTION PANEL
ALT: 11 U/L (ref 0–35)
Bilirubin, Direct: 0 mg/dL (ref 0.0–0.3)
Total Bilirubin: 0.6 mg/dL (ref 0.3–1.2)

## 2012-01-29 NOTE — Progress Notes (Signed)
  Subjective:    Patient ID: Stacey Melton, female    DOB: [birth year], 78 y.o.   MRN: [id number]  HPI 78 yr old female for a cpx. She feels fine and has no concerns. She remains active and does all her own yard and house work. She recently joined [employer] again.    Review of Systems  Constitutional: Negative.   HENT: Negative.   Eyes: Negative.   Respiratory: Negative.   Cardiovascular: Negative.   Gastrointestinal: Negative.   Genitourinary: Negative for dysuria, urgency, frequency, hematuria, flank pain, decreased urine volume, enuresis, difficulty urinating, pelvic pain and dyspareunia.  Musculoskeletal: Negative.   Skin: Negative.   Neurological: Negative.   Hematological: Negative.   Psychiatric/Behavioral: Negative.        Objective:   Physical Exam  Constitutional: She is oriented to person, place, and time. She appears well-developed and well-nourished. No distress.  HENT:  Head: Normocephalic and atraumatic.  Right Ear: External ear normal.  Left Ear: External ear normal.  Nose: Nose normal.  Mouth/Throat: Oropharynx is clear and moist. No oropharyngeal exudate.  Eyes: Conjunctivae and EOM are normal. Pupils are equal, round, and reactive to light. No scleral icterus.  Neck: Normal range of motion. Neck supple. No JVD present. No thyromegaly present.  Cardiovascular: Normal rate, regular rhythm, normal heart sounds and intact distal pulses.  Exam reveals no gallop and no friction rub.   No murmur heard.      EKG normal   Pulmonary/Chest: Effort normal and breath sounds normal. No respiratory distress. She has no wheezes. She has no rales. She exhibits no tenderness.  Abdominal: Soft. Bowel sounds are normal. She exhibits no distension and no mass. There is no tenderness. There is no rebound and no guarding.  Musculoskeletal: Normal range of motion. She exhibits no edema and no tenderness.  Lymphadenopathy:    She has no cervical adenopathy.  Neurological:  She is alert and oriented to person, place, and time. She has normal reflexes. No cranial nerve deficit. She exhibits normal muscle tone. Coordination normal.  Skin: Skin is warm and dry. No rash noted. No erythema.  Psychiatric: She has a normal mood and affect. Her behavior is normal. Judgment and thought content normal.          Assessment & Plan:  Get fasting labs. She is past due for a colonoscopy, so she will contact Dr. Silvana Newness office in [place].

## 2012-01-30 NOTE — Progress Notes (Signed)
Quick Note:  I left voice message for pt to return my call. ______ 

## 2012-01-31 ENCOUNTER — Telehealth: Payer: Self-pay | Admitting: Family Medicine

## 2012-01-31 MED ORDER — CIPROFLOXACIN HCL 500 MG PO TABS: 500.0000 mg | ORAL_TABLET | Freq: Two times a day (BID) | ORAL | Status: DC

## 2012-01-31 NOTE — Progress Notes (Signed)
Quick Note:  I spoke with pt and sent script e-scribe. ______ 

## 2012-01-31 NOTE — Progress Notes (Signed)
Addended by: Aniceto Boss A on: 01/31/2012 12:09 PM   Modules accepted: Orders

## 2012-01-31 NOTE — Telephone Encounter (Signed)
Pulled from triage vmail - pt called at 8:19. States she would like you to call her back with lab results from Tues. Also stated she would not be home after 8:30, but did not leave alternate number to call.

## 2012-02-01 NOTE — Telephone Encounter (Signed)
I spoke with pt  

## 2012-05-15 ENCOUNTER — Ambulatory Visit (INDEPENDENT_AMBULATORY_CARE_PROVIDER_SITE_OTHER): Payer: Medicare Other | Admitting: Family Medicine

## 2012-05-15 ENCOUNTER — Encounter: Payer: Self-pay | Admitting: Family Medicine

## 2012-05-15 VITALS — BP 118/76 | HR 59 | Temp 98.9°F | Wt 185.0 lb

## 2012-05-15 DIAGNOSIS — N390 Urinary tract infection, site not specified: Secondary | ICD-10-CM

## 2012-05-15 LAB — POCT URINALYSIS DIPSTICK
Bilirubin, UA: NEGATIVE
Glucose, UA: NEGATIVE
Urobilinogen, UA: 0.2
pH, UA: 6

## 2012-05-15 MED ORDER — CIPROFLOXACIN HCL 500 MG PO TABS: 500.0000 mg | ORAL_TABLET | Freq: Two times a day (BID) | ORAL | Status: AC

## 2012-05-15 NOTE — Progress Notes (Signed)
  Subjective:    Patient ID: Stacey Melton, female    DOB: June 09, 1933, 78 y.o.   MRN: [id number]  HPI Here for 3 days of urgency to urinate with little urine produced. No fever or burning. No nausea. Drinking plenty of water.    Review of Systems  Constitutional: Negative.   Gastrointestinal: Negative.   Genitourinary: Positive for urgency. Negative for dysuria, frequency, hematuria, flank pain and pelvic pain.       Objective:   Physical Exam  Constitutional: She appears well-developed and well-nourished.  Abdominal: Soft. Bowel sounds are normal. She exhibits no distension and no mass. There is no tenderness. There is no rebound and no guarding.          Assessment & Plan:  Use Cipro. Await culture result

## 2012-05-18 LAB — URINE CULTURE: Colony Count: >=100000

## 2012-05-20 NOTE — Progress Notes (Signed)
Quick Note:  Called and spoke with pt and pt is aware. ______ 

## 2012-07-02 ENCOUNTER — Ambulatory Visit: Payer: Medicare Other | Admitting: Family Medicine

## 2012-10-10 ENCOUNTER — Telehealth: Payer: Self-pay | Admitting: Family Medicine

## 2012-10-10 MED ORDER — GABAPENTIN 100 MG PO CAPS: 100.0000 mg | ORAL_CAPSULE | Freq: Two times a day (BID) | ORAL | Status: DC

## 2012-10-10 NOTE — Telephone Encounter (Signed)
Call in #60 with 11 rf 

## 2012-10-10 NOTE — Telephone Encounter (Signed)
Refill request for Gabapentin 100 mg take 1 po bid, can we refill this?

## 2012-10-10 NOTE — Telephone Encounter (Signed)
Pt notified Rx for Gabapentin 100 mg was sent to pharmacy.

## 2012-12-09 ENCOUNTER — Encounter: Payer: Self-pay | Admitting: Family Medicine

## 2012-12-09 ENCOUNTER — Ambulatory Visit (INDEPENDENT_AMBULATORY_CARE_PROVIDER_SITE_OTHER): Payer: Medicare Other | Admitting: Family Medicine

## 2012-12-09 VITALS — BP 128/80 | HR 92 | Temp 99.2°F | Wt 195.0 lb

## 2012-12-09 DIAGNOSIS — L723 Sebaceous cyst: Secondary | ICD-10-CM

## 2012-12-09 DIAGNOSIS — L259 Unspecified contact dermatitis, unspecified cause: Secondary | ICD-10-CM

## 2012-12-09 DIAGNOSIS — L089 Local infection of the skin and subcutaneous tissue, unspecified: Secondary | ICD-10-CM

## 2012-12-09 MED ORDER — CEPHALEXIN 500 MG PO CAPS: 500.0000 mg | ORAL_CAPSULE | Freq: Three times a day (TID) | ORAL | Status: DC

## 2012-12-09 MED ORDER — METHYLPREDNISOLONE 4 MG PO KIT: PACK | ORAL | Status: AC

## 2012-12-09 NOTE — Progress Notes (Signed)
  Subjective:    Patient ID: Stacey Melton, female    DOB: May 19, 1933, 79 y.o.   MRN: [id number]  HPI Here for 2 things. First she has had a painful lump in the middle of the back for several weeks. Second she got into some poison ivy last week while cleaning brush at her house. She has an itchy rash on the right arm.    Review of Systems  Constitutional: Negative.   Skin: Positive for rash and wound.       Objective:   Physical Exam  Constitutional: She appears well-developed and well-nourished.  Skin:  The right forearm has a large area of red maculovesicular skin. The middle back has a 2 cm tender boil          Assessment & Plan:  Treat the dermatitis with a Medrol dose pack. The abscessed cyst was evacuated with firm pressure and a large amount of purulent material was extracted. Treat with Keflex.

## 2012-12-17 ENCOUNTER — Other Ambulatory Visit: Payer: Self-pay

## 2012-12-17 MED ORDER — HYDROCHLOROTHIAZIDE 12.5 MG PO TABS: 12.5000 mg | ORAL_TABLET | Freq: Every day | ORAL | Status: DC

## 2012-12-17 MED ORDER — AMLODIPINE BESYLATE 5 MG PO TABS: 5.0000 mg | ORAL_TABLET | Freq: Every day | ORAL | Status: DC

## 2012-12-17 NOTE — Addendum Note (Signed)
Addended by: Azucena Freed on: 12/17/2012 03:33 PM   Modules accepted: Orders

## 2012-12-17 NOTE — Telephone Encounter (Signed)
Rx for hctz sent to pharmacy.

## 2012-12-17 NOTE — Telephone Encounter (Signed)
Rx sent to pharmacy for amlodipine 5 mg.

## 2013-01-23 ENCOUNTER — Telehealth: Payer: Self-pay | Admitting: Family Medicine

## 2013-01-23 MED ORDER — SERTRALINE HCL 50 MG PO TABS: 50.0000 mg | ORAL_TABLET | Freq: Every day | ORAL | Status: DC

## 2013-01-23 NOTE — Telephone Encounter (Signed)
I sent script e-scribe. 

## 2013-01-23 NOTE — Telephone Encounter (Signed)
Okay for one year  

## 2013-01-23 NOTE — Telephone Encounter (Signed)
Refill request for Sertraline 50 mg take 1 po qd and a 90 day supply.

## 2013-02-02 ENCOUNTER — Telehealth: Payer: Self-pay | Admitting: Family Medicine

## 2013-02-03 NOTE — Telephone Encounter (Signed)
duplicate

## 2013-07-14 ENCOUNTER — Ambulatory Visit (INDEPENDENT_AMBULATORY_CARE_PROVIDER_SITE_OTHER): Payer: Medicare Other | Admitting: Family Medicine

## 2013-07-14 ENCOUNTER — Encounter: Payer: Self-pay | Admitting: Family Medicine

## 2013-07-14 VITALS — BP 136/80 | HR 60 | Temp 98.6°F | Wt 193.0 lb

## 2013-07-14 DIAGNOSIS — Z23 Encounter for immunization: Secondary | ICD-10-CM

## 2013-07-14 DIAGNOSIS — M545 Low back pain, unspecified: Secondary | ICD-10-CM

## 2013-07-14 MED ORDER — HYDROCHLOROTHIAZIDE 12.5 MG PO TABS: 12.5000 mg | ORAL_TABLET | Freq: Every day | ORAL | Status: DC

## 2013-07-14 MED ORDER — AMLODIPINE BESYLATE 5 MG PO TABS: 5.0000 mg | ORAL_TABLET | Freq: Every day | ORAL | Status: DC

## 2013-07-14 MED ORDER — DICLOFENAC SODIUM 75 MG PO TBEC: 75.0000 mg | DELAYED_RELEASE_TABLET | Freq: Two times a day (BID) | ORAL | Status: DC

## 2013-07-14 NOTE — Progress Notes (Signed)
  Subjective:    Patient ID: Stacey Melton, female    DOB: 23-Mar-1933, 79 y.o.   MRN: [id number]  HPI Here for one month of an intermittent aching pain in the lower back. This does not radiate to the legs. Advil helps a little. It started after she raked some leaves in her yard.    Review of Systems  Constitutional: Negative.   Musculoskeletal: Positive for back pain. Negative for gait problem.       Objective:   Physical Exam  Constitutional: She appears well-developed and well-nourished.  Musculoskeletal:  Tender in the lower back with full ROM. No spasm.           Assessment & Plan:  This is some arthritis that has flared up. Rest, apply heat. Try Diclofenac bid.

## 2013-07-14 NOTE — Progress Notes (Signed)
Pre visit review using our clinic review tool, if applicable. No additional management support is needed unless otherwise documented below in the visit note. 

## 2013-11-25 ENCOUNTER — Encounter: Payer: Self-pay | Admitting: Family Medicine

## 2013-12-23 ENCOUNTER — Telehealth: Payer: Self-pay | Admitting: Family Medicine

## 2013-12-23 NOTE — Telephone Encounter (Signed)
[facility] - [place] - [address] Requesting re-fill on HYDROcodone-acetaminophen (VICODIN) 5-500 MG per tablet

## 2013-12-24 MED ORDER — HYDROCODONE-ACETAMINOPHEN 5-500 MG PO TABS: 1.0000 | ORAL_TABLET | Freq: Three times a day (TID) | ORAL | Status: DC | PRN

## 2013-12-24 NOTE — Telephone Encounter (Signed)
done

## 2013-12-25 NOTE — Telephone Encounter (Signed)
Script is ready for pick up and I left a voice message for pt. 

## 2014-01-05 ENCOUNTER — Telehealth: Payer: Self-pay | Admitting: Family Medicine

## 2014-01-05 MED ORDER — HYDROCHLOROTHIAZIDE 12.5 MG PO TABS: 12.5000 mg | ORAL_TABLET | Freq: Every day | ORAL | Status: DC

## 2014-01-05 NOTE — Telephone Encounter (Signed)
[facility] - [place] - [address] Is requesting re-fill on hydrochlorothiazide (HYDRODIURIL) 12.5 MG tablet

## 2014-01-05 NOTE — Telephone Encounter (Signed)
I sent script e-scribe. 

## 2014-02-05 ENCOUNTER — Other Ambulatory Visit: Payer: Self-pay | Admitting: Family Medicine

## 2014-03-12 ENCOUNTER — Telehealth: Payer: Self-pay | Admitting: Family Medicine

## 2014-03-12 NOTE — Telephone Encounter (Signed)
[facility] - [place], [place] - [address] Is requesting re-fill on hydrochlorothiazide (HYDRODIURIL) 12.5 MG tablet ° °

## 2014-03-15 MED ORDER — HYDROCHLOROTHIAZIDE 12.5 MG PO TABS: 12.5000 mg | ORAL_TABLET | Freq: Every day | ORAL | Status: DC

## 2014-03-15 NOTE — Telephone Encounter (Signed)
I sent script e-scribe. 

## 2014-07-12 ENCOUNTER — Ambulatory Visit (INDEPENDENT_AMBULATORY_CARE_PROVIDER_SITE_OTHER): Payer: Medicare Other | Admitting: Family Medicine

## 2014-07-12 ENCOUNTER — Ambulatory Visit: Payer: Medicare Other | Admitting: Family Medicine

## 2014-07-12 ENCOUNTER — Encounter: Payer: Self-pay | Admitting: Family Medicine

## 2014-07-12 VITALS — BP 126/84 | Temp 99.0°F | Ht 63.0 in | Wt 197.0 lb

## 2014-07-12 DIAGNOSIS — M150 Primary generalized (osteo)arthritis: Secondary | ICD-10-CM

## 2014-07-12 DIAGNOSIS — M545 Low back pain, unspecified: Secondary | ICD-10-CM | POA: Insufficient documentation

## 2014-07-12 DIAGNOSIS — M5440 Lumbago with sciatica, unspecified side: Secondary | ICD-10-CM

## 2014-07-12 DIAGNOSIS — F32A Depression, unspecified: Secondary | ICD-10-CM

## 2014-07-12 DIAGNOSIS — I10 Essential (primary) hypertension: Secondary | ICD-10-CM

## 2014-07-12 DIAGNOSIS — M159 Polyosteoarthritis, unspecified: Secondary | ICD-10-CM

## 2014-07-12 DIAGNOSIS — F329 Major depressive disorder, single episode, unspecified: Secondary | ICD-10-CM

## 2014-07-12 MED ORDER — HYDROCHLOROTHIAZIDE 12.5 MG PO TABS: 12.5000 mg | ORAL_TABLET | Freq: Every day | ORAL | Status: DC

## 2014-07-12 MED ORDER — SERTRALINE HCL 50 MG PO TABS: ORAL_TABLET | ORAL | Status: DC

## 2014-07-12 MED ORDER — DICLOFENAC SODIUM 75 MG PO TBEC: 75.0000 mg | DELAYED_RELEASE_TABLET | Freq: Two times a day (BID) | ORAL | Status: DC

## 2014-07-12 MED ORDER — HYDROCODONE-ACETAMINOPHEN 5-500 MG PO TABS: 1.0000 | ORAL_TABLET | Freq: Four times a day (QID) | ORAL | Status: DC | PRN

## 2014-07-12 MED ORDER — AMLODIPINE BESYLATE 5 MG PO TABS: 5.0000 mg | ORAL_TABLET | Freq: Every day | ORAL | Status: DC

## 2014-07-12 NOTE — Progress Notes (Signed)
   Subjective:    Patient ID: Stacey CarpenterGaythleen Pursifull, female    DOB: [birth year], 80 y.o.   MRN: [id number]  HPI Here for follow up and to discuss low back pain. This has bothered her for several years. It mostly acts up when she does a lot of yard work or [employer] the house. She usually takes Tylenol but will use Vicodin at times. Her BP is stable.    Review of Systems  Constitutional: Negative.   Respiratory: Negative.   Cardiovascular: Negative.   Musculoskeletal: Positive for back pain.       Objective:   Physical Exam  Constitutional: She appears well-developed and well-nourished.  Cardiovascular: Normal rate, regular rhythm, normal heart sounds and intact distal pulses.   Pulmonary/Chest: Effort normal and breath sounds normal.  Musculoskeletal:  Tender in the lower back with full ROM          Assessment & Plan:  We will set up some Xrays of her lumbar spine. Refilled meds. Her HTN is stable. Her moods are stable.

## 2014-07-12 NOTE — Progress Notes (Signed)
Pre visit review using our clinic review tool, if applicable. No additional management support is needed unless otherwise documented below in the visit note. 

## 2014-07-15 ENCOUNTER — Telehealth: Payer: Self-pay | Admitting: Family Medicine

## 2014-07-15 NOTE — Telephone Encounter (Signed)
Pharm called to clarify directions for HYDROcodone-acetaminophen (VICODIN) 5-500 MG per tablet  Pharm states there are 2 sets of directions. pls cb

## 2014-07-16 NOTE — Telephone Encounter (Signed)
Looks like the old script for Vicodin was printed, we need to print a new script.

## 2014-07-19 NOTE — Telephone Encounter (Signed)
I spoke with pt and she does not need the script at this time, she will call back if and when she needs it.

## 2014-07-19 NOTE — Telephone Encounter (Signed)
It should say "one to two tablets at bedtime as needed for pain"

## 2014-09-22 ENCOUNTER — Ambulatory Visit (INDEPENDENT_AMBULATORY_CARE_PROVIDER_SITE_OTHER)
Admission: RE | Admit: 2014-09-22 | Discharge: 2014-09-22 | Disposition: A | Discharge status: Home / Self Care | Payer: Medicare HMO | Source: Ambulatory Visit | Attending: Family Medicine | Admitting: Family Medicine

## 2014-09-22 DIAGNOSIS — M5440 Lumbago with sciatica, unspecified side: Secondary | ICD-10-CM

## 2014-10-26 ENCOUNTER — Ambulatory Visit (INDEPENDENT_AMBULATORY_CARE_PROVIDER_SITE_OTHER): Payer: Medicare HMO | Admitting: Family Medicine

## 2014-10-26 ENCOUNTER — Encounter: Payer: Self-pay | Admitting: Family Medicine

## 2014-10-26 VITALS — BP 140/90 | HR 63 | Temp 99.5°F | Ht 63.0 in | Wt 200.0 lb

## 2014-10-26 DIAGNOSIS — M5431 Sciatica, right side: Secondary | ICD-10-CM | POA: Insufficient documentation

## 2014-10-26 DIAGNOSIS — M5441 Lumbago with sciatica, right side: Secondary | ICD-10-CM

## 2014-10-26 MED ORDER — METHYLPREDNISOLONE ACETATE 80 MG/ML IJ SUSP
120.0000 mg | Freq: Once | INTRAMUSCULAR | Status: AC
  Administered 2014-10-26: 120 mg via INTRAMUSCULAR

## 2014-10-26 NOTE — Progress Notes (Signed)
Pre visit review using our clinic review tool, if applicable. No additional management support is needed unless otherwise documented below in the visit note. 

## 2014-10-26 NOTE — Addendum Note (Signed)
Addended by: Aniceto BossNIMMONS, SYLVIA A on: 10/26/2014 01:56 PM   Modules accepted: Orders

## 2014-10-26 NOTE — Progress Notes (Signed)
   Subjective:    Patient ID: Stacey Melton, female    DOB: 04-Jan-1933, 81 y.o.   MRN: [id number]  HPI Here to follow up on low back pain which remains about the same. However for the past week she has also had sharp pains that radiate from the right lower back down the lateral right leg to the foot. No numbness or weakness. She takes Diclofenac once a day and adds an occasional Vicodin to sleep at night. Her lumbar Xrays last time showed diffuse degenerative disc changes.    Review of Systems  Constitutional: Negative.   Musculoskeletal: Positive for back pain and gait problem.       Objective:   Physical Exam  Constitutional:  Walks with a slight limp   Musculoskeletal:  Tender in the lower back and over the right sciatic notch. ROM is reduced of the lower spine but SLR are negative           Assessment & Plan:  I advised her to increase the Diclofenac to BID every day and she was given a steroid shot today. Recheck prn

## 2014-12-29 ENCOUNTER — Ambulatory Visit: Payer: Medicare HMO | Admitting: Family Medicine

## 2015-01-24 ENCOUNTER — Encounter: Payer: Self-pay | Admitting: Family Medicine

## 2015-01-24 ENCOUNTER — Ambulatory Visit (INDEPENDENT_AMBULATORY_CARE_PROVIDER_SITE_OTHER): Payer: Medicare HMO | Admitting: Family Medicine

## 2015-01-24 VITALS — BP 162/85 | HR 62 | Temp 97.6°F | Ht 63.0 in | Wt 196.0 lb

## 2015-01-24 DIAGNOSIS — M5431 Sciatica, right side: Secondary | ICD-10-CM

## 2015-01-24 DIAGNOSIS — M5441 Lumbago with sciatica, right side: Secondary | ICD-10-CM

## 2015-01-24 MED ORDER — METHOCARBAMOL 750 MG PO TABS: 750.0000 mg | ORAL_TABLET | Freq: Every day | ORAL | Status: DC

## 2015-01-24 NOTE — Progress Notes (Signed)
Pre visit review using our clinic review tool, if applicable. No additional management support is needed unless otherwise documented below in the visit note. 

## 2015-01-24 NOTE — Progress Notes (Signed)
   Subjective:    Patient ID: Stacey Melton, female    DOB: 21-Mar-1933, 81 y.o.   MRN: [id number]  HPI Here to follow up low back pain and right leg pain. She continues to have pain in the right leg that keep her awake at night. At her last visit we gave her a steroid shot and this helped for a week or so.    Review of Systems  Constitutional: Negative.   Musculoskeletal: Positive for back pain and gait problem.       Objective:   Physical Exam  Constitutional: She appears well-developed and well-nourished.  limping          Assessment & Plan:  Try Robaxin at bedtime. Set her up for PT.

## 2015-02-02 ENCOUNTER — Encounter: Payer: Self-pay | Admitting: Physical Therapy

## 2015-02-02 ENCOUNTER — Ambulatory Visit: Payer: Medicare HMO | Attending: Family Medicine | Admitting: Physical Therapy

## 2015-02-02 DIAGNOSIS — M5417 Radiculopathy, lumbosacral region: Secondary | ICD-10-CM | POA: Insufficient documentation

## 2015-02-02 DIAGNOSIS — M5416 Radiculopathy, lumbar region: Secondary | ICD-10-CM

## 2015-02-02 NOTE — Patient Instructions (Signed)
Piriformis Stretch, Sitting   Sit, one ankle on opposite knee, same-side hand on crossed knee. Push down on knee, keeping spine straight. Lean torso forward, with flat back, until tension is felt in hamstrings and gluteals of crossed-leg side. Hold 30___ seconds.  Repeat _2__ times per session. Do _1__ sessions per day.  Copyright  VHI. All rights reserved.  Knee-to-Chest Stretch: Unilateral   With hand behind right knee, pull knee in to chest until a comfortable stretch is felt in lower back and buttocks. Keep back relaxed. Hold _30___ seconds. Repeat _2___ times per set. Do __1__ sets per session. Do __1__ sessions per day.  http://orth.exer.us/126   Copyright  VHI. All rights reserved.   Lower Trunk Rotation Stretch   Keeping back flat and feet together, rotate knees to left side. Hold 15____ seconds. Repeat __2__ times per set. Do __1__ sets per session. Do _1___ sessions per day.  http://orth.exer.us/122   Copyright  VHI. All rights reserved.  Mid-Back Stretch   Push chest toward floor, reaching forward as far as possible. Hold _15___ seconds. Repeat _2___ times per set. Do ___1_ sets per session. Do __1__ sessions per day.  http://orth.exer.us/130   Copyright  VHI. All rights reserved.  Angry Cat Stretch   Tuck chin and tighten stomach, arching back. Repeat _10___ times per set. Do __1__ sets per session. Do _1___ sessions per day.  http://orth.exer.us/118   Copyright  VHI. All rights reserved.  Strengthening: Hip Abduction - Resisted  Hold onto counter With tubing around right leg, other side toward anchor, extend leg out from side. Repeat _20___ times per set. Do __1__ sets per session. Do __1__ sessions per day. No band until easy and you can slow http://orth.exer.us/634   Copyright  VHI. All rights reserved.  Strengthening: Hip Extension - Resisted  Hold onto counter With tubing around right ankle, face anchor and pull leg straight back. Repeat  __20__ times per set. Do _1___ sets per session. Do __1__ sessions per day. When get easy use the band http://orth.exer.us/636   Copyright  VHI. All rights reserved.  Sleeping on Side   Place pillow between knees. Use cervical support under neck and a roll around waist as needed.   Copyright  VHI. All rights reserved.  Posture - Sitting   Sit upright, head facing forward. Try using a roll to support lower back. Keep shoulders relaxed, and avoid rounded back. Keep hips level with knees. Avoid crossing legs for long periods.   Copyright  VHI. All rights reserved.  Lifting Principles .Maintain proper posture and head alignment. .Slide object as close as possible before lifting. .Move obstacles out of the way. .Test before lifting; ask for help if too heavy. .Tighten stomach muscles without holding breath. .Use smooth movements; do not jerk. .Use legs to do the work, and pivot with feet. .Distribute the work load symmetrically and close to the center of trunk. .Push instead of pull whenever possible.  Copyright  VHI. All rights reserved.  Deep Squat   Squat and lift with both arms held against upper trunk. Tighten stomach muscles without holding breath. Use smooth movements to avoid jerking.  Copyright  VHI. All rights reserved.  Cart   When reaching into cart with one arm, lift opposite leg to keep back straight.   Copyright  VHI. All rights reserved.   [facility] [address], [address] [place], [place] [address] Phone # [phone] Fax [phone]

## 2015-02-02 NOTE — Therapy (Signed)
[facility] [address], [address] [place], [place], [address] Phone: [phone]   Fax:  [phone]  Physical Therapy Evaluation  Patient Details  Name: Stacey CarpenterGaythleen Melton MRN: [id number] Date of Birth: [birth year] Referring Provider:  Nelwyn SalisburyFry, Stephen A, MD  Encounter Date: 02/02/2015      PT End of Session - 02/02/15 1306    Visit Number 1   PT Start Time 1230   PT Stop Time 1310   PT Time Calculation (min) 40 min   Activity Tolerance Patient tolerated treatment well   Behavior During Therapy [facility] for tasks assessed/performed      Past Medical History  Diagnosis Date  . Allergy   . COPD (chronic obstructive pulmonary disease)   . GERD (gastroesophageal reflux disease)   . Hypertension   . Depression   . Hemorrhoids   . Osteoarthritis   . Insomnia   . Hematuria     microscopic  . Cystocele   . History of recurrent UTIs     Past Surgical History  Procedure Laterality Date  . Abdominal hysterectomy    . Colonoscopy  07/07/08    repeat in 3 yrs Dr. Charm BargesButler  . Cystoscopy  12/21/08    normal Dr. Aldean AstKimbrough    There were no vitals filed for this visit.  Visit Diagnosis:  Lumbar back pain with radiculopathy affecting right lower extremity - Plan: PT plan of care cert/re-cert      Subjective Assessment - 02/02/15 1235    Subjective Patient reports lumbar pain with right leg pain that has been chronic for several years and last 6 months the right leg started with pain especially at night. Patient pain started suddenly.    Limitations Standing;Walking   How long can you sit comfortably? no difficulty   How long can you stand comfortably? 30 min    How long can you walk comfortably? 1 mile   Patient Stated Goals Patient reports she wants to learn how to manage her pain   Currently in Pain? Yes   Pain Score 3   highest pain 10/10   Pain Location Back  right leg   Pain Orientation Right   Pain Descriptors / Indicators  Aching   Pain Type Chronic pain   Pain Radiating Towards right leg   Pain Onset More than a month ago  right leg pain began 6 months ago   Pain Frequency Intermittent   Aggravating Factors  when she lays down on left side   Pain Relieving Factors ointment   Multiple Pain Sites No            [facility] PT Assessment - 02/02/15 0001    Assessment   Medical Diagnosis M54.31 Sciatica of right side; M54.41 right sided low back pain with right sided sciatica   Onset Date/Surgical Date 09/04/14   Prior Therapy None   Precautions   Precautions None   Balance Screen   Has the patient fallen in the past 6 months No   Has the patient had a decrease in activity level because of a fear of falling?  No   Is the patient reluctant to leave their home because of a fear of falling?  No   Home [employer] manag[employer]vironment   Living Environment Private residence   Living Arrangements Alone   Prior Function   Level of Independence Independent   Vocation Retired   Observation/Other Assessments   Focus on Therapeutic Outcomes (FOTO)  40% limitation   ROM / Strength  AROM / PROM / Strength --  right knee and ankle 5/5; right hip abd 3/5; right hip ext.    AROM   Lumbar Flexion full with pain at end   Lumbar Extension decreased by 50% with pain   Lumbar - Right Side Bend decreased by 25% with pain   Flexibility   Soft Tissue Assessment /Muscle Length yes   Hamstrings tight   Palpation   Palpation comment Palpable tenderness located in right lumbar paraspinals and right gluteal are                           PT Education - 02/06/2015 1305    Education provided Yes   Education Details hip abd and ext in standing, flexibility exercises, lifting and posture principles   Person(s) Educated Patient   Methods Explanation;Demonstration;Tactile cues;Verbal cues;Handout   Comprehension Returned demonstration;Verbalized understanding             PT Long Term Goals - 02/06/15 1317    PT LONG  TERM GOAL #1   Title independent with initial HEP   Time 1   Period Days   Status Achieved   PT LONG TERM GOAL #2   Title understand correct body mechanics with lifting and sitting   Time 1   Period Days   Status Achieved               Plan - 02/06/2015 1311    Clinical Impression Statement Patient is a 81 year old female with lumbar pain and right sciatica.  Patient reports the back pain started suddenly many years ago and right leg pain began 6 months ago with difficulty sleeping. Patient lumbar ROM is limited by 25% with right sidebending and  extension decreased by 50% with pain at endrange.  Patient right hi pextension is 3+/5 and abduction is 3/5.  Patient has palpable tenderness located in right gluteal and lumbar paraspinals.  Patient ambulates with a limp and circumducts the right leg.  Patient is only able to come to the initial evaluation due to the long drive and does not want to go to another facility.  Patient was educated on back flexibility exercises and correct body mechanics.    Pt will benefit from skilled therapeutic intervention in order to improve on the following deficits Pain;Decreased strength;Decreased mobility   Rehab Potential Excellent   PT Frequency 1x / week   PT Duration --  for the initial evaluation   PT Treatment/Interventions Patient/family education;Therapeutic exercise;Therapeutic activities   PT Next Visit Plan Discharge to HEP   PT Home Exercise Plan Current HEP   Recommended Other Services None   Consulted and Agree with Plan of Care Patient          G-Codes - 2015/02/06 1310    Functional Assessment Tool Used FOTO score is 40% limitaiton   Functional Limitation Other PT primary   Other PT Primary Current Status (U7253) At least 40 percent but less than 60 percent impaired, limited or restricted   Other PT Primary Goal Status (G6440) At least 40 percent but less than 60 percent impaired, limited or restricted   Other PT Primary Discharge  Status (H4742) At least 40 percent but less than 60 percent impaired, limited or restricted       Problem List Patient Active Problem List   Diagnosis Date Noted  . Sciatica of right side 10/26/2014  . Low back pain 07/12/2014  . SCOLIOSIS 08/21/2010  . SHINGLES  06/19/2010  . ECZEMA 06/19/2010  . INSOMNIA 08/11/2009  . UNSPECIFIED URINARY INCONTINENCE 06/29/2008  . COUGH 06/16/2008  . Depression 08/04/2007  . CYSTITIS 07/04/2007  . Essential hypertension 07/01/2007  . ALLERGIC RHINITIS 07/01/2007  . COPD 07/01/2007  . GERD 07/01/2007  . Osteoarthritis 07/01/2007    GRAY,CHERYL,PT 02/02/2015, 1:20 PM  [facility] [address], [address] [place], [place], [address] Phone: [phone]   Fax:  [phone]

## 2015-02-17 ENCOUNTER — Other Ambulatory Visit: Payer: Self-pay | Admitting: Family Medicine

## 2015-02-17 NOTE — Telephone Encounter (Signed)
Refill for 6 months. 

## 2015-07-18 ENCOUNTER — Other Ambulatory Visit: Payer: Self-pay | Admitting: Family Medicine

## 2015-09-07 ENCOUNTER — Other Ambulatory Visit: Payer: Self-pay | Admitting: Family Medicine

## 2015-09-28 ENCOUNTER — Encounter: Payer: Self-pay | Admitting: Family Medicine

## 2015-09-28 ENCOUNTER — Ambulatory Visit (INDEPENDENT_AMBULATORY_CARE_PROVIDER_SITE_OTHER): Payer: Medicare HMO | Admitting: Family Medicine

## 2015-09-28 VITALS — BP 148/85 | HR 59 | Temp 98.7°F | Ht 63.0 in | Wt 198.0 lb

## 2015-09-28 DIAGNOSIS — M5441 Lumbago with sciatica, right side: Secondary | ICD-10-CM

## 2015-09-28 DIAGNOSIS — Z23 Encounter for immunization: Secondary | ICD-10-CM | POA: Diagnosis not present

## 2015-09-28 MED ORDER — ZOLPIDEM TARTRATE 10 MG PO TABS: ORAL_TABLET | ORAL | Status: DC

## 2015-09-28 MED ORDER — TRAMADOL HCL 50 MG PO TABS: 100.0000 mg | ORAL_TABLET | Freq: Every day | ORAL | Status: DC

## 2015-09-28 NOTE — Progress Notes (Signed)
Pre visit review using our clinic review tool, if applicable. No additional management support is needed unless otherwise documented below in the visit note. 

## 2015-09-28 NOTE — Progress Notes (Signed)
   Subjective:    Patient ID: Stacey Melton, female    DOB: 1932/09/04, 82 y.o.   MRN: [id number]  HPI Here to follow up on low back pain and left left leg pain. She is taking Diclofenac bid. Robaxin did not help. She does well during the day but she has trouble with pain in bed each night.    Review of Systems  Constitutional: Negative.   Respiratory: Negative.   Cardiovascular: Negative.   Musculoskeletal: Positive for back pain.       Objective:   Physical Exam  Constitutional: She is oriented to person, place, and time. She appears well-developed and well-nourished.  Neck: No thyromegaly present.  Cardiovascular: Normal rate, regular rhythm, normal heart sounds and intact distal pulses.   Pulmonary/Chest: Effort normal and breath sounds normal.  Lymphadenopathy:    She has no cervical adenopathy.  Neurological: She is alert and oriented to person, place, and time.          Assessment & Plan:  Back pain, she will try taking Tramadol each night at bedtime.

## 2015-11-03 ENCOUNTER — Other Ambulatory Visit: Payer: Self-pay | Admitting: Family Medicine

## 2016-01-19 ENCOUNTER — Encounter: Payer: Self-pay | Admitting: Family Medicine

## 2016-01-19 ENCOUNTER — Ambulatory Visit (INDEPENDENT_AMBULATORY_CARE_PROVIDER_SITE_OTHER): Payer: Medicare HMO | Admitting: Family Medicine

## 2016-01-19 VITALS — BP 161/83 | HR 60 | Temp 98.3°F | Ht 63.0 in | Wt 193.0 lb

## 2016-01-19 DIAGNOSIS — M150 Primary generalized (osteo)arthritis: Secondary | ICD-10-CM | POA: Diagnosis not present

## 2016-01-19 DIAGNOSIS — I10 Essential (primary) hypertension: Secondary | ICD-10-CM | POA: Diagnosis not present

## 2016-01-19 DIAGNOSIS — R69 Illness, unspecified: Secondary | ICD-10-CM | POA: Diagnosis not present

## 2016-01-19 DIAGNOSIS — F329 Major depressive disorder, single episode, unspecified: Secondary | ICD-10-CM | POA: Diagnosis not present

## 2016-01-19 DIAGNOSIS — G4700 Insomnia, unspecified: Secondary | ICD-10-CM | POA: Diagnosis not present

## 2016-01-19 DIAGNOSIS — M159 Polyosteoarthritis, unspecified: Secondary | ICD-10-CM

## 2016-01-19 DIAGNOSIS — F32A Depression, unspecified: Secondary | ICD-10-CM

## 2016-01-19 LAB — BASIC METABOLIC PANEL
BUN: 17 mg/dL (ref 6–23)
CO2: 29 meq/L (ref 19–32)
Calcium: 9.4 mg/dL (ref 8.4–10.5)
Chloride: 104 mEq/L (ref 96–112)
Creatinine, Ser: 0.81 mg/dL (ref 0.40–1.20)
GFR: 72.11 mL/min (ref >60.00)
GLUCOSE: 115 mg/dL — AB (ref 70–99)
Potassium: 4.1 mEq/L (ref 3.5–5.1)
Sodium: 140 mEq/L (ref 135–145)

## 2016-01-19 LAB — LIPID PANEL
Cholesterol: 233 mg/dL — ABNORMAL HIGH (ref 0–200)
HDL: 39 mg/dL — AB (ref >39.00)
LDL Cholesterol: 158 mg/dL — ABNORMAL HIGH (ref 0–99)
NonHDL: 193.51
Total CHOL/HDL Ratio: 6
Triglycerides: 179 mg/dL — ABNORMAL HIGH (ref 0.0–149.0)
VLDL: 35.8 mg/dL (ref 0.0–40.0)

## 2016-01-19 LAB — POC URINALSYSI DIPSTICK (AUTOMATED)
Bilirubin, UA: NEGATIVE
Glucose, UA: NEGATIVE
Ketones, UA: NEGATIVE
Nitrite, UA: POSITIVE
Spec Grav, UA: 1.025
UROBILINOGEN UA: 0.2
pH, UA: 5.5

## 2016-01-19 LAB — HEPATIC FUNCTION PANEL
ALBUMIN: 4.3 g/dL (ref 3.5–5.2)
ALT: 14 U/L (ref 0–35)
AST: 15 U/L (ref 0–37)
Alkaline Phosphatase: 49 U/L (ref 39–117)
Bilirubin, Direct: 0.1 mg/dL (ref 0.0–0.3)
TOTAL PROTEIN: 6.6 g/dL (ref 6.0–8.3)
Total Bilirubin: 0.6 mg/dL (ref 0.2–1.2)

## 2016-01-19 LAB — CBC WITH DIFFERENTIAL/PLATELET
Basophils Absolute: 0 10*3/uL (ref 0.0–0.1)
Basophils Relative: 0.3 % (ref 0.0–3.0)
EOS ABS: 0.1 10*3/uL (ref 0.0–0.7)
Eosinophils Relative: 1.3 % (ref 0.0–5.0)
HCT: 44.8 % (ref 36.0–46.0)
HEMOGLOBIN: 15 g/dL (ref 12.0–15.0)
Lymphocytes Relative: 12.6 % (ref 12.0–46.0)
Lymphs Abs: 1 10*3/uL (ref 0.7–4.0)
MCHC: 33.4 g/dL (ref 30.0–36.0)
MCV: 85.7 fl (ref 78.0–100.0)
MONO ABS: 0.4 10*3/uL (ref 0.1–1.0)
Monocytes Relative: 5.8 % (ref 3.0–12.0)
Neutro Abs: 6.2 10*3/uL (ref 1.4–7.7)
Neutrophils Relative %: 80 % — ABNORMAL HIGH (ref 43.0–77.0)
Platelets: 235 10*3/uL (ref 150.0–400.0)
RBC: 5.23 Mil/uL — ABNORMAL HIGH (ref 3.87–5.11)
RDW: 13.7 % (ref 11.5–15.5)
WBC: 7.7 10*3/uL (ref 4.0–10.5)

## 2016-01-19 LAB — TSH: TSH: 1.07 u[IU]/mL (ref 0.35–4.50)

## 2016-01-19 MED ORDER — SERTRALINE HCL 50 MG PO TABS
ORAL_TABLET | ORAL | Status: DC
Start: 2016-01-19 — End: 2016-04-25

## 2016-01-19 MED ORDER — ZOLPIDEM TARTRATE 10 MG PO TABS: ORAL_TABLET | ORAL | Status: DC

## 2016-01-19 MED ORDER — DICLOFENAC SODIUM 75 MG PO TBEC: 75.0000 mg | DELAYED_RELEASE_TABLET | Freq: Two times a day (BID) | ORAL | Status: DC

## 2016-01-19 NOTE — Progress Notes (Signed)
Pre visit review using our clinic review tool, if applicable. No additional management support is needed unless otherwise documented below in the visit note. 

## 2016-01-20 ENCOUNTER — Encounter: Payer: Self-pay | Admitting: Family Medicine

## 2016-01-20 NOTE — Progress Notes (Signed)
   Subjective:    Patient ID: Rosaura CarpenterGaythleen Cashen, female    DOB: September 13, 1932, 82 y.o.   MRN: [id number]  HPI Here for follow up. She is doing fairly well in general. Her joint pains come and go but she remains active. Her depression is stable, though she still has bad days when she gets lonely and misses her late husband. She sleeps well.    Review of Systems  Constitutional: Negative.   Respiratory: Negative.   Cardiovascular: Negative.   Musculoskeletal: Positive for back pain and arthralgias.  Neurological: Negative.   Psychiatric/Behavioral: Negative.        Objective:   Physical Exam  Constitutional: She is oriented to person, place, and time. She appears well-developed and well-nourished.  Neck: No thyromegaly present.  Cardiovascular: Normal rate, regular rhythm, normal heart sounds and intact distal pulses.   Pulmonary/Chest: Effort normal and breath sounds normal.  Lymphadenopathy:    She has no cervical adenopathy.  Neurological: She is alert and oriented to person, place, and time.  Psychiatric: She has a normal mood and affect. Her behavior is normal. Thought content normal.          Assessment & Plan:  Her arthritis is stable, refilled Diclofenac. Her depression is stable, refilled Zoloft. Her insomnia is stable, refilled the Zolpidem.  Nelwyn SalisburyFRY,Karsyn Rochin A, MD

## 2016-02-29 ENCOUNTER — Telehealth: Payer: Self-pay

## 2016-02-29 NOTE — Telephone Encounter (Signed)
Patient is on the list for [employer] 2017 and may be a good candidate for an AWV in 2017. Please let me know if/when appt is scheduled.   

## 2016-04-10 ENCOUNTER — Other Ambulatory Visit: Payer: Self-pay | Admitting: Family Medicine

## 2016-04-10 NOTE — Telephone Encounter (Signed)
Refill request for Zolpidem Tartrate 10 mg and send to [facility].

## 2016-04-11 MED ORDER — ZOLPIDEM TARTRATE 10 MG PO TABS
ORAL_TABLET | ORAL | 5 refills | Status: DC
Start: 2016-04-11 — End: 2018-04-23

## 2016-04-11 NOTE — Telephone Encounter (Signed)
I spoke with pt and pharmacy, pt lost script, per Dr. Clent RidgesFry okay to refill for 6 months and I called in script.

## 2016-04-12 ENCOUNTER — Other Ambulatory Visit: Payer: Self-pay | Admitting: Family Medicine

## 2016-04-25 ENCOUNTER — Other Ambulatory Visit: Payer: Self-pay | Admitting: Family Medicine

## 2016-05-02 ENCOUNTER — Telehealth: Payer: Self-pay | Admitting: Internal Medicine

## 2016-05-02 NOTE — Telephone Encounter (Signed)
Pt request to transfer from Dr. Clent RidgesFry to Dr. Okey Duprerawford. Please advise.

## 2016-05-02 NOTE — Telephone Encounter (Signed)
That would be fine with me.  

## 2016-05-03 ENCOUNTER — Encounter: Payer: Self-pay | Admitting: Internal Medicine

## 2016-05-03 ENCOUNTER — Ambulatory Visit (INDEPENDENT_AMBULATORY_CARE_PROVIDER_SITE_OTHER): Payer: Medicare HMO | Admitting: Internal Medicine

## 2016-05-03 DIAGNOSIS — M5441 Lumbago with sciatica, right side: Secondary | ICD-10-CM

## 2016-05-03 MED ORDER — DULOXETINE HCL 30 MG PO CPEP: 30.0000 mg | ORAL_CAPSULE | Freq: Every day | ORAL | 3 refills | Status: DC

## 2016-05-03 NOTE — Progress Notes (Signed)
Pre visit review using our clinic review tool, if applicable. No additional management support is needed unless otherwise documented below in the visit note. 

## 2016-05-03 NOTE — Assessment & Plan Note (Signed)
She is taking her voltaren and tramadol (not daily). Offered for her to switch the zoloft to cymbalta to see if she still gets depression coverage as well as maybe more pain benefit. She is not sure she wants to make that change. Informed her that I am not taking new patients right now but that there are other providers at the office who could take her.

## 2016-05-03 NOTE — Patient Instructions (Addendum)
We have several good providers at this office including Claris GowerCharlotte who is our nurse practitioner who may be able to take you on as a new patient.   If you are looking for a change to see if this helps with your pain more. You can stop taking the zoloft (sertraline) and start taking cymbalta (duloxetine) which I have sent in to the pharmacy. Do not take both at once. It is okay to stop zoloft and then start the cymbalta the next day.   If you are feeling fine the way that you are, you do not need to make any changes.

## 2016-05-03 NOTE — Telephone Encounter (Signed)
Left vm for pt to callback 

## 2016-05-03 NOTE — Telephone Encounter (Signed)
Unfortunately I am not able to accept transfers now. We do have several other providers who are taking transfers at this office is she is just wanting to switch locations.

## 2016-05-03 NOTE — Progress Notes (Signed)
   Subjective:    Patient ID: Stacey Melton, female    DOB: 03-27-1933, 82 y.o.   MRN: [id number]  HPI The patient is an 82 YO female coming in for the intention of switching providers. She does not have any new complaints. She is having still her chronic back pain. She is taking tramadol some days but not everyday. She does take zoloft for depression and wonders if she should be on it. She feels that her back pain has gradually worsened over the last several years. She does not have a back specialist and has not tried other therapies. She is taking voltaren BID and this helps some unless she does activities that she knows she should not.   Review of Systems  Constitutional: Negative.   Respiratory: Negative.   Cardiovascular: Negative.   Gastrointestinal: Negative.   Musculoskeletal: Positive for back pain. Negative for arthralgias, gait problem and myalgias.  Skin: Negative.   Psychiatric/Behavioral: Negative.       Objective:   Physical Exam  Constitutional: She is oriented to person, place, and time. She appears well-developed and well-nourished.  Overweight  Cardiovascular: Normal rate and regular rhythm.   Pulmonary/Chest: Effort normal. No respiratory distress. She has no wheezes.  Abdominal: Soft.  Neurological: She is alert and oriented to person, place, and time. Coordination normal.  Skin: Skin is warm and dry.   Vitals:   05/03/16 1031  BP: (!) 150/78  Pulse: (!) 56  Resp: 12  Temp: 99.4 F (37.4 C)  TempSrc: Oral  SpO2: 99%  Weight: 194 lb 12.8 oz (88.4 kg)  Height: 5\' 5"  (1.651 m)      Assessment & Plan:

## 2016-05-08 NOTE — Telephone Encounter (Signed)
Pt will stay with Dr. Clent RidgesFry.

## 2016-07-04 ENCOUNTER — Telehealth: Payer: Self-pay

## 2016-07-04 NOTE — Telephone Encounter (Signed)
Call to advise AWV due. To call back to [facility] and schedule

## 2016-11-22 ENCOUNTER — Other Ambulatory Visit: Payer: Self-pay | Admitting: Family Medicine

## 2017-02-14 ENCOUNTER — Other Ambulatory Visit: Payer: Self-pay | Admitting: Family Medicine

## 2017-02-21 ENCOUNTER — Other Ambulatory Visit: Payer: Self-pay | Admitting: Family Medicine

## 2017-02-26 ENCOUNTER — Other Ambulatory Visit: Payer: Self-pay | Admitting: Family Medicine

## 2017-02-28 ENCOUNTER — Other Ambulatory Visit: Payer: Self-pay | Admitting: Family Medicine

## 2017-03-05 ENCOUNTER — Ambulatory Visit: Payer: Medicare HMO | Admitting: Family Medicine

## 2017-07-09 ENCOUNTER — Ambulatory Visit: Payer: Medicare HMO | Admitting: Family Medicine

## 2018-04-14 ENCOUNTER — Other Ambulatory Visit: Payer: Self-pay | Admitting: Family Medicine

## 2018-04-18 ENCOUNTER — Other Ambulatory Visit: Payer: Self-pay | Admitting: Family Medicine

## 2018-04-18 NOTE — Telephone Encounter (Signed)
Pt needs to come in for an office visit has not been seen since 2017.

## 2018-04-23 ENCOUNTER — Encounter: Payer: Self-pay | Admitting: Family Medicine

## 2018-04-23 ENCOUNTER — Ambulatory Visit (INDEPENDENT_AMBULATORY_CARE_PROVIDER_SITE_OTHER): Payer: Medicare HMO | Admitting: Family Medicine

## 2018-04-23 VITALS — BP 122/70 | HR 66 | Temp 98.1°F | Ht 65.0 in | Wt 196.0 lb

## 2018-04-23 DIAGNOSIS — Z0000 Encounter for general adult medical examination without abnormal findings: Secondary | ICD-10-CM | POA: Diagnosis not present

## 2018-04-23 DIAGNOSIS — I10 Essential (primary) hypertension: Secondary | ICD-10-CM | POA: Diagnosis not present

## 2018-04-23 DIAGNOSIS — G4700 Insomnia, unspecified: Secondary | ICD-10-CM | POA: Diagnosis not present

## 2018-04-23 DIAGNOSIS — Z23 Encounter for immunization: Secondary | ICD-10-CM

## 2018-04-23 LAB — BASIC METABOLIC PANEL
BUN: 23 mg/dL (ref 6–23)
CO2: 31 meq/L (ref 19–32)
Calcium: 9.5 mg/dL (ref 8.4–10.5)
Chloride: 102 mEq/L (ref 96–112)
Creatinine, Ser: 0.98 mg/dL (ref 0.40–1.20)
GFR: 57.56 mL/min — ABNORMAL LOW (ref >60.00)
GLUCOSE: 135 mg/dL — AB (ref 70–99)
Potassium: 4.1 mEq/L (ref 3.5–5.1)
SODIUM: 142 meq/L (ref 135–145)

## 2018-04-23 LAB — HEPATIC FUNCTION PANEL
ALK PHOS: 50 U/L (ref 39–117)
ALT: 9 U/L (ref 0–35)
AST: 12 U/L (ref 0–37)
Albumin: 4.3 g/dL (ref 3.5–5.2)
Bilirubin, Direct: 0.1 mg/dL (ref 0.0–0.3)
Total Bilirubin: 0.6 mg/dL (ref 0.2–1.2)
Total Protein: 6.7 g/dL (ref 6.0–8.3)

## 2018-04-23 LAB — CBC WITH DIFFERENTIAL/PLATELET
BASOS ABS: 0 10*3/uL (ref 0.0–0.1)
Basophils Relative: 0.6 % (ref 0.0–3.0)
EOS ABS: 0.3 10*3/uL (ref 0.0–0.7)
Eosinophils Relative: 4.1 % (ref 0.0–5.0)
HCT: 44 % (ref 36.0–46.0)
Hemoglobin: 14.6 g/dL (ref 12.0–15.0)
LYMPHS ABS: 1.2 10*3/uL (ref 0.7–4.0)
Lymphocytes Relative: 16.2 % (ref 12.0–46.0)
MCHC: 33.2 g/dL (ref 30.0–36.0)
MCV: 87 fl (ref 78.0–100.0)
Monocytes Absolute: 0.6 10*3/uL (ref 0.1–1.0)
Monocytes Relative: 8.4 % (ref 3.0–12.0)
NEUTROS ABS: 5.2 10*3/uL (ref 1.4–7.7)
NEUTROS PCT: 70.7 % (ref 43.0–77.0)
Platelets: 235 10*3/uL (ref 150.0–400.0)
RBC: 5.06 Mil/uL (ref 3.87–5.11)
RDW: 13.9 % (ref 11.5–15.5)
WBC: 7.4 10*3/uL (ref 4.0–10.5)

## 2018-04-23 LAB — TSH: TSH: 0.82 u[IU]/mL (ref 0.35–4.50)

## 2018-04-23 LAB — POC URINALSYSI DIPSTICK (AUTOMATED)
BILIRUBIN UA: NEGATIVE
Glucose, UA: NEGATIVE
KETONES UA: NEGATIVE
Nitrite, UA: POSITIVE
Protein, UA: POSITIVE — AB
Spec Grav, UA: >=1.03 — AB (ref 1.010–1.025)
Urobilinogen, UA: 1 E.U./dL
pH, UA: 5 (ref 5.0–8.0)

## 2018-04-23 MED ORDER — HYDROCHLOROTHIAZIDE 12.5 MG PO CAPS: 12.5000 mg | ORAL_CAPSULE | Freq: Every day | ORAL | 3 refills | Status: DC

## 2018-04-23 MED ORDER — AMLODIPINE BESYLATE 5 MG PO TABS: 5.0000 mg | ORAL_TABLET | Freq: Every day | ORAL | 3 refills | Status: DC

## 2018-04-23 MED ORDER — LORAZEPAM 1 MG PO TABS: 1.0000 mg | ORAL_TABLET | Freq: Every day | ORAL | 1 refills | Status: DC

## 2018-04-23 NOTE — Progress Notes (Signed)
   Subjective:    Patient ID: Stacey CarpenterGaythleen Melton, female    DOB: 1933/02/24, 84 y.o.   MRN: [id number]  HPI Here to follow up. She is doing well. Her BP is stable. She is here with her daughter but she still drives herself around. She still does some of her yard work but she has help with this. She stopped taking Ambien because she felt it affected her memory, and she wants to try something else.    Review of Systems  Constitutional: Negative.   Respiratory: Negative.   Cardiovascular: Negative.   Gastrointestinal: Negative.   Neurological: Negative.   Psychiatric/Behavioral: Positive for sleep disturbance. Negative for agitation and dysphoric mood. The patient is not nervous/anxious.        Objective:   Physical Exam  Constitutional: She is oriented to person, place, and time. She appears well-developed and well-nourished.  Neck: No thyromegaly present.  Cardiovascular: Normal rate, regular rhythm, normal heart sounds and intact distal pulses.  Pulmonary/Chest: Effort normal and breath sounds normal.  Musculoskeletal: She exhibits no edema.  Neurological: She is alert and oriented to person, place, and time.          Assessment & Plan:  Her HTN I stable. Get non-fasting labs today. Try Ativan for sleep.  Gershon CraneStephen Fry, MD

## 2018-06-25 ENCOUNTER — Other Ambulatory Visit: Payer: Self-pay | Admitting: Family Medicine

## 2018-06-25 NOTE — Telephone Encounter (Signed)
Dr. Clent Ridges please advise on refill of medication.  Looks like the pt said she was not taking this at her last visit and it was removed from her list.

## 2019-04-27 ENCOUNTER — Other Ambulatory Visit: Payer: Self-pay | Admitting: Family Medicine

## 2019-06-01 ENCOUNTER — Telehealth: Payer: Self-pay | Admitting: *Deleted

## 2019-06-01 ENCOUNTER — Ambulatory Visit: Payer: Medicare HMO | Admitting: Family

## 2019-06-01 NOTE — Telephone Encounter (Signed)
Clinic RN attempted to contact patient to check on her. No answer on home phone and no answer machine.

## 2019-06-01 NOTE — Telephone Encounter (Signed)
Patient called after hours line on 05/30/2019. Patient was concerned she has a bladder infection. Has pain with urination. Wondering if an antibiotic could be called in. Patient was advised to go to ED.   Clinic RN seen that patient did not go to ED. Called paitent and patient wanted to be seen at [facility] since its closer. Appointment made for 1pm today at [facility]. Patient then changed her mind and cancelled appointment and reports she is feeling better and will call back later to make appointment with Dr. Sarajane Jews for tomorrow since she is feeling better. Advised patient with UTIs they can get worse travel up to kidneys. Patient still wants to wait until tomorrow

## 2019-06-01 NOTE — Telephone Encounter (Signed)
Have her make an in person OV so we can get a sample if she still has symptoms

## 2019-06-03 NOTE — Telephone Encounter (Signed)
Spoke to pt and she stated she is feeling a lot better. Pt never went to the ED. I asked pt if she would like for me to schedule an appt. Pt declined and stated that the drive is too far and she is getting older. Pt also want me to let Dr.Fry know that she will be changing practices and providers due to the drive. She wanted me to let Dr.Fry know that she thinks highly of him and will truly miss him.

## 2019-06-03 NOTE — Telephone Encounter (Signed)
Pt is going to stay for now! Just leave it that for now. She will get her daughter to drive her.

## 2019-06-03 NOTE — Telephone Encounter (Signed)
Pt stated that she will stay with Dr Sarajane Jews for now, she stated she lives out in [place] and that is why she was thinking about switching

## 2019-06-04 NOTE — Telephone Encounter (Signed)
Noted  

## 2019-07-06 DIAGNOSIS — R69 Illness, unspecified: Secondary | ICD-10-CM | POA: Diagnosis not present

## 2019-11-09 ENCOUNTER — Other Ambulatory Visit: Payer: Self-pay | Admitting: Family Medicine

## 2019-11-09 NOTE — Telephone Encounter (Signed)
Last filled 06/25/2018 Last OV 04/23/2018  Ok to fill?

## 2020-01-12 ENCOUNTER — Other Ambulatory Visit: Payer: Self-pay

## 2020-01-13 ENCOUNTER — Ambulatory Visit (INDEPENDENT_AMBULATORY_CARE_PROVIDER_SITE_OTHER): Payer: Medicare HMO | Admitting: Family Medicine

## 2020-01-13 ENCOUNTER — Encounter: Payer: Self-pay | Admitting: Family Medicine

## 2020-01-13 VITALS — BP 140/70 | HR 65 | Temp 97.6°F | Ht 65.0 in | Wt 189.4 lb

## 2020-01-13 DIAGNOSIS — K219 Gastro-esophageal reflux disease without esophagitis: Secondary | ICD-10-CM | POA: Diagnosis not present

## 2020-01-13 DIAGNOSIS — R739 Hyperglycemia, unspecified: Secondary | ICD-10-CM

## 2020-01-13 DIAGNOSIS — J449 Chronic obstructive pulmonary disease, unspecified: Secondary | ICD-10-CM | POA: Diagnosis not present

## 2020-01-13 DIAGNOSIS — I10 Essential (primary) hypertension: Secondary | ICD-10-CM

## 2020-01-13 DIAGNOSIS — M153 Secondary multiple arthritis: Secondary | ICD-10-CM | POA: Diagnosis not present

## 2020-01-13 LAB — CBC WITH DIFFERENTIAL/PLATELET
Basophils Absolute: 0 10*3/uL (ref 0.0–0.1)
Basophils Relative: 0.6 % (ref 0.0–3.0)
Eosinophils Absolute: 0.4 10*3/uL (ref 0.0–0.7)
Eosinophils Relative: 6.9 % — ABNORMAL HIGH (ref 0.0–5.0)
HCT: 44.5 % (ref 36.0–46.0)
Hemoglobin: 14.7 g/dL (ref 12.0–15.0)
Lymphocytes Relative: 17.8 % (ref 12.0–46.0)
Lymphs Abs: 1 10*3/uL (ref 0.7–4.0)
MCHC: 33 g/dL (ref 30.0–36.0)
MCV: 89.1 fl (ref 78.0–100.0)
Monocytes Absolute: 0.6 10*3/uL (ref 0.1–1.0)
Monocytes Relative: 9.8 % (ref 3.0–12.0)
Neutro Abs: 3.7 10*3/uL (ref 1.4–7.7)
Neutrophils Relative %: 64.9 % (ref 43.0–77.0)
Platelets: 227 10*3/uL (ref 150.0–400.0)
RBC: 4.99 Mil/uL (ref 3.87–5.11)
RDW: 13.5 % (ref 11.5–15.5)
WBC: 5.8 10*3/uL (ref 4.0–10.5)

## 2020-01-13 LAB — LIPID PANEL
Cholesterol: 208 mg/dL — ABNORMAL HIGH (ref 0–200)
HDL: 47.3 mg/dL (ref >39.00)
LDL Cholesterol: 134 mg/dL — ABNORMAL HIGH (ref 0–99)
NonHDL: 161.05
Total CHOL/HDL Ratio: 4
Triglycerides: 135 mg/dL (ref 0.0–149.0)
VLDL: 27 mg/dL (ref 0.0–40.0)

## 2020-01-13 LAB — HEPATIC FUNCTION PANEL
ALT: 11 U/L (ref 0–35)
AST: 14 U/L (ref 0–37)
Albumin: 4.2 g/dL (ref 3.5–5.2)
Alkaline Phosphatase: 48 U/L (ref 39–117)
Bilirubin, Direct: 0.1 mg/dL (ref 0.0–0.3)
Total Bilirubin: 0.6 mg/dL (ref 0.2–1.2)
Total Protein: 6.4 g/dL (ref 6.0–8.3)

## 2020-01-13 LAB — BASIC METABOLIC PANEL
BUN: 15 mg/dL (ref 6–23)
CO2: 33 mEq/L — ABNORMAL HIGH (ref 19–32)
Calcium: 9.3 mg/dL (ref 8.4–10.5)
Chloride: 104 mEq/L (ref 96–112)
Creatinine, Ser: 0.82 mg/dL (ref 0.40–1.20)
GFR: 66.24 mL/min (ref >60.00)
Glucose, Bld: 84 mg/dL (ref 70–99)
Potassium: 4.5 mEq/L (ref 3.5–5.1)
Sodium: 141 mEq/L (ref 135–145)

## 2020-01-13 LAB — HEMOGLOBIN A1C: Hgb A1c MFr Bld: 5.2 % (ref 4.6–6.5)

## 2020-01-13 LAB — TSH: TSH: 1.05 u[IU]/mL (ref 0.35–4.50)

## 2020-01-13 MED ORDER — HYDROCHLOROTHIAZIDE 12.5 MG PO CAPS: 12.5000 mg | ORAL_CAPSULE | Freq: Every day | ORAL | 3 refills | Status: DC

## 2020-01-13 MED ORDER — AMLODIPINE BESYLATE 5 MG PO TABS: 5.0000 mg | ORAL_TABLET | Freq: Every day | ORAL | 3 refills | Status: DC

## 2020-01-13 NOTE — Progress Notes (Signed)
   Subjective:    Patient ID: Stacey Melton, female    DOB: 09-Feb-1933, 86 y.o.   MRN: [id number]  HPI Here with her daughter to follow up on issues. She fells well and has no complaints. She still drives a little during the day, she prepares her own food, and she does her own yardwork. We had treated her for some depression a few years ago, but this has resolved and she stopped taking Zoloft and Ativan last year. Her BP is stable.    Review of Systems  Constitutional: Negative.   HENT: Negative.   Eyes: Negative.   Respiratory: Negative.   Cardiovascular: Negative.   Gastrointestinal: Negative.   Genitourinary: Negative for decreased urine volume, difficulty urinating, dyspareunia, dysuria, enuresis, flank pain, frequency, hematuria, pelvic pain and urgency.  Musculoskeletal: Negative.   Skin: Negative.   Neurological: Negative.   Psychiatric/Behavioral: Negative.        Objective:   Physical Exam Constitutional:      General: She is not in acute distress.    Appearance: She is well-developed.  HENT:     Head: Normocephalic and atraumatic.     Right Ear: External ear normal.     Left Ear: External ear normal.     Nose: Nose normal.     Mouth/Throat:     Pharynx: No oropharyngeal exudate.  Eyes:     General: No scleral icterus.    Conjunctiva/sclera: Conjunctivae normal.     Pupils: Pupils are equal, round, and reactive to light.  Neck:     Thyroid: No thyromegaly.     Vascular: No JVD.  Cardiovascular:     Rate and Rhythm: Normal rate and regular rhythm.     Heart sounds: Normal heart sounds. No murmur. No friction rub. No gallop.   Pulmonary:     Effort: Pulmonary effort is normal. No respiratory distress.     Breath sounds: Normal breath sounds. No wheezing or rales.  Chest:     Chest wall: No tenderness.  Abdominal:     General: Bowel sounds are normal. There is no distension.     Palpations: Abdomen is soft. There is no mass.     Tenderness: There is no  abdominal tenderness. There is no guarding or rebound.  Musculoskeletal:        General: No tenderness. Normal range of motion.     Cervical back: Normal range of motion and neck supple.  Lymphadenopathy:     Cervical: No cervical adenopathy.  Skin:    General: Skin is warm and dry.     Findings: No erythema or rash.  Neurological:     Mental Status: She is alert and oriented to person, place, and time.     Cranial Nerves: No cranial nerve deficit.     Motor: No abnormal muscle tone.     Coordination: Coordination normal.     Deep Tendon Reflexes: Reflexes are normal and symmetric. Reflexes normal.  Psychiatric:        Behavior: Behavior normal.        Thought Content: Thought content normal.        Judgment: Judgment normal.           Assessment & Plan:  Her HTN and osteoarthritis are stable. Her depression is in remission. We will get fasting labs today for lipids, etc. She is UTD on immunizations including both Covid shots.  Gershon Crane, MD

## 2020-04-11 DIAGNOSIS — H52223 Regular astigmatism, bilateral: Secondary | ICD-10-CM | POA: Diagnosis not present

## 2020-04-11 DIAGNOSIS — H524 Presbyopia: Secondary | ICD-10-CM | POA: Diagnosis not present

## 2020-08-17 ENCOUNTER — Other Ambulatory Visit: Payer: Self-pay

## 2020-08-17 ENCOUNTER — Encounter: Payer: Self-pay | Admitting: Family Medicine

## 2020-08-17 ENCOUNTER — Ambulatory Visit (INDEPENDENT_AMBULATORY_CARE_PROVIDER_SITE_OTHER): Payer: Medicare HMO | Admitting: Family Medicine

## 2020-08-17 VITALS — BP 128/80 | HR 65 | Temp 98.2°F | Resp 16 | Wt 193.2 lb

## 2020-08-17 DIAGNOSIS — F418 Other specified anxiety disorders: Secondary | ICD-10-CM

## 2020-08-17 DIAGNOSIS — R69 Illness, unspecified: Secondary | ICD-10-CM | POA: Diagnosis not present

## 2020-08-17 DIAGNOSIS — R413 Other amnesia: Secondary | ICD-10-CM

## 2020-08-17 MED ORDER — SERTRALINE HCL 100 MG PO TABS
100.0000 mg | ORAL_TABLET | Freq: Every day | ORAL | 3 refills | Status: DC
Start: 2020-08-17 — End: 2021-09-13

## 2020-08-17 NOTE — Progress Notes (Signed)
   Subjective:    Patient ID: Stacey Melton, female    DOB: 11/23/1932, 86 y.o.   MRN: [id number]  HPI Here with her daughter Stacey Melton to discuss some depression symptoms and difficulty thinking the past 6 weeks. She has been taking Zoloft 50 mg daily for about 4 years, but she admits that she does not take it regularly. She still grieves the loss of her husband and she finds it hard to be alone sometimes. She stays active by walking and working in her yard when the weather permits. Her appetite is good, but she often has trouble sleeping. She says that she has trouble focusing at times, such as when paying bills, and her daughter has noticed this. She has had some mild memory issues with recent events, and her daughter gives the example of having a telephone call with a family member one day and then not remembering this conversation the next day. Her daughter lives in [place] (about one hour away) and she visits the patient once a week. I think Stacey Melton is doing well physically, and her physical in May came out well.    Review of Systems  Constitutional: Negative.   Respiratory: Negative.   Cardiovascular: Negative.   Psychiatric/Behavioral: Positive for confusion, decreased concentration, dysphoric mood and sleep disturbance. Negative for agitation, behavioral problems, hallucinations, self-injury and suicidal ideas. The patient is nervous/anxious.        Objective:   Physical Exam Constitutional:      Appearance: Normal appearance.  Cardiovascular:     Rate and Rhythm: Normal rate and regular rhythm.     Pulses: Normal pulses.     Heart sounds: Normal heart sounds.  Pulmonary:     Effort: Pulmonary effort is normal.     Breath sounds: Normal breath sounds.  Neurological:     General: No focal deficit present.     Mental Status: She is alert and oriented to person, place, and time.  Psychiatric:        Mood and Affect: Mood normal.        Behavior: Behavior normal.         Thought Content: Thought content normal.        Judgment: Judgment normal.           Assessment & Plan:  She is still dealing with some depression and this is definitely affecting her mental abilities. We agreed to increase the Zoloft to 100 mg daily. I stressed that she msut take this every day for it to work properly. She may be showing some memory loss independent of this also , and we will keep an eye on this. I asked to see her back in 3-4 weeks. We spent 40 minutes discussing this today.  Gershon Crane, MD

## 2020-09-03 DIAGNOSIS — G3184 Mild cognitive impairment, so stated: Secondary | ICD-10-CM

## 2020-09-03 HISTORY — DX: Mild cognitive impairment of uncertain or unknown etiology: G31.84

## 2020-10-12 DIAGNOSIS — H43811 Vitreous degeneration, right eye: Secondary | ICD-10-CM | POA: Diagnosis not present

## 2020-10-12 DIAGNOSIS — H04123 Dry eye syndrome of bilateral lacrimal glands: Secondary | ICD-10-CM | POA: Diagnosis not present

## 2020-10-12 DIAGNOSIS — H353124 Nonexudative age-related macular degeneration, left eye, advanced atrophic with subfoveal involvement: Secondary | ICD-10-CM | POA: Diagnosis not present

## 2020-10-12 DIAGNOSIS — H353113 Nonexudative age-related macular degeneration, right eye, advanced atrophic without subfoveal involvement: Secondary | ICD-10-CM | POA: Diagnosis not present

## 2020-10-19 ENCOUNTER — Encounter: Payer: Self-pay | Admitting: Family Medicine

## 2020-10-19 ENCOUNTER — Telehealth (INDEPENDENT_AMBULATORY_CARE_PROVIDER_SITE_OTHER): Payer: Medicare HMO | Admitting: Family Medicine

## 2020-10-19 VITALS — Ht 65.0 in | Wt 180.0 lb

## 2020-10-19 DIAGNOSIS — N390 Urinary tract infection, site not specified: Secondary | ICD-10-CM

## 2020-10-19 MED ORDER — CIPROFLOXACIN HCL 500 MG PO TABS
500.0000 mg | ORAL_TABLET | Freq: Two times a day (BID) | ORAL | 0 refills | Status: DC
Start: 2020-10-19 — End: 2020-11-26

## 2020-10-19 NOTE — Progress Notes (Signed)
   Subjective:    Patient ID: Stacey Melton, female    DOB: [birth year], 87 y.o.   MRN: [id number]  HPI Virtual Visit via Telephone Note  I connected with the patient on 10/19/20 at  2:15 PM EST by telephone and verified that I am speaking with the correct person using two identifiers.   I discussed the limitations, risks, security and privacy concerns of performing an evaluation and management service by telephone and the availability of in person appointments. I also discussed with the patient that there may be a patient responsible charge related to this service. The patient expressed understanding and agreed to proceed.  Location patient: home Location provider: work or home office Participants present for the call: patient, provider Patient did not have a visit in the prior 7 days to address this/these issue(s).   History of Present Illness: Here for 3 days of urge to urinate with burning. No nausea or fever.    Observations/Objective: Patient sounds cheerful and well on the phone. I do not appreciate any SOB. Speech and thought processing are grossly intact. Patient reported vitals:  Assessment and Plan: UTI, treat with Cipro for 7 days. Drink lots of water.  Gershon Crane, MD   Follow Up Instructions:     [phone] 11-20 [phone] 21-30 I did not refer this patient for an OV in the next 24 hours for this/these issue(s).  I discussed the assessment and treatment plan with the patient. The patient was provided an opportunity to ask questions and all were answered. The patient agreed with the plan and demonstrated an understanding of the instructions.   The patient was advised to call back or seek an in-person evaluation if the symptoms worsen or if the condition fails to improve as anticipated.  I provided 10 minutes of non-face-to-face time during this encounter.   Gershon Crane, MD   Review of Systems     Objective:   Physical Exam        Assessment  & Plan:

## 2020-10-24 DIAGNOSIS — H2512 Age-related nuclear cataract, left eye: Secondary | ICD-10-CM | POA: Diagnosis not present

## 2020-11-08 DIAGNOSIS — H2513 Age-related nuclear cataract, bilateral: Secondary | ICD-10-CM | POA: Diagnosis not present

## 2020-11-08 DIAGNOSIS — H2512 Age-related nuclear cataract, left eye: Secondary | ICD-10-CM | POA: Diagnosis not present

## 2020-11-26 ENCOUNTER — Other Ambulatory Visit: Payer: Self-pay | Admitting: Family Medicine

## 2020-11-26 NOTE — Telephone Encounter (Signed)
She took Cipro a few weeks ago for UTI symptoms and they went away. Now for 2 days the urgency and burning are back. No fever or nausea. We will send in 7 days of bactrim DS BID.

## 2020-12-06 ENCOUNTER — Other Ambulatory Visit: Payer: Self-pay

## 2020-12-06 ENCOUNTER — Ambulatory Visit (INDEPENDENT_AMBULATORY_CARE_PROVIDER_SITE_OTHER): Payer: Medicare HMO | Admitting: Family Medicine

## 2020-12-06 ENCOUNTER — Encounter: Payer: Self-pay | Admitting: Family Medicine

## 2020-12-06 VITALS — BP 128/82 | HR 52 | Temp 98.5°F | Wt 189.0 lb

## 2020-12-06 DIAGNOSIS — M545 Low back pain, unspecified: Secondary | ICD-10-CM | POA: Diagnosis not present

## 2020-12-06 DIAGNOSIS — G8929 Other chronic pain: Secondary | ICD-10-CM

## 2020-12-06 DIAGNOSIS — R3915 Urgency of urination: Secondary | ICD-10-CM | POA: Diagnosis not present

## 2020-12-06 LAB — POC URINALSYSI DIPSTICK (AUTOMATED)
Bilirubin, UA: NEGATIVE
Glucose, UA: NEGATIVE
Ketones, UA: NEGATIVE
Nitrite, UA: NEGATIVE
Protein, UA: POSITIVE — AB
Spec Grav, UA: 1.025 (ref 1.010–1.025)
Urobilinogen, UA: 1 E.U./dL
pH, UA: 6 (ref 5.0–8.0)

## 2020-12-06 MED ORDER — DICLOFENAC SODIUM 75 MG PO TBEC: 75.0000 mg | DELAYED_RELEASE_TABLET | Freq: Two times a day (BID) | ORAL | 2 refills | Status: DC | PRN

## 2020-12-06 NOTE — Progress Notes (Signed)
   Subjective:    Patient ID: Stacey Melton, female    DOB: 10-30-1932, 87 y.o.   MRN: [id number]  HPI Here for 2 days of right sided low back pains. These are sharp and they get worse when she moves her body certain ways. The pain does not radiate. Tylenol helps a little. No urninary symptoms. This started after she spent all day working in her yard. She had plain Xrays of the lumbar spine in 2016 showing advanced degeneration of the discs and facet arthropathy.   Review of Systems  Constitutional: Negative.   Respiratory: Negative.   Cardiovascular: Negative.   Musculoskeletal: Positive for back pain.       Objective:   Physical Exam Constitutional:      Appearance: Normal appearance.  Cardiovascular:     Rate and Rhythm: Normal rate and regular rhythm.     Pulses: Normal pulses.     Heart sounds: Normal heart sounds.  Pulmonary:     Effort: Pulmonary effort is normal.     Breath sounds: Normal breath sounds.  Musculoskeletal:     Comments: She is tender in the lower back, more so over the spine. ROM is reduced due to pain. No tenderness over the sciatic notches. Negative SLR   Neurological:     Mental Status: She is alert.           Assessment & Plan:  Low back pain. She will try Diclofenac as needed. Recheck prn. Gershon Crane, MD

## 2020-12-12 NOTE — Progress Notes (Signed)
Subjective:   Stacey Melton is a 87 y.o. female who presents for an Initial Medicare Annual Wellness Visit. I connected with Stacey Melton today by telephone and verified that I am speaking with the correct person using two identifiers. Location patient: home Location provider: work Persons participating in the virtual visit: patient, provider.   I discussed the limitations, risks, security and privacy concerns of performing an evaluation and management service by telephone and the availability of in person appointments. I also discussed with the patient that there may be a patient responsible charge related to this service. The patient expressed understanding and verbally consented to this telephonic visit.    Interactive audio and video telecommunications were attempted between this provider and patient, however failed, due to patient having technical difficulties OR patient did not have access to video capability.  We continued and completed visit with audio only.     Review of Systems    n/a       Objective:    There were no vitals filed for this visit. There is no height or weight on file to calculate BMI.  Advanced Directives 02/02/2015  Does Patient Have a Medical Advance Directive? No  Would patient like information on creating a medical advance directive? No - patient declined information    Current Medications (verified) Outpatient Encounter Medications as of 12/13/2020  Medication Sig  . amLODipine (NORVASC) 5 MG tablet Take 1 tablet (5 mg total) by mouth daily.  . diclofenac (VOLTAREN) 75 MG EC tablet Take 1 tablet (75 mg total) by mouth 2 (two) times daily as needed for mild pain.  . hydrochlorothiazide (MICROZIDE) 12.5 MG capsule Take 1 capsule (12.5 mg total) by mouth daily.  . sertraline (ZOLOFT) 100 MG tablet Take 1 tablet (100 mg total) by mouth daily.   No facility-administered encounter medications on file as of 12/13/2020.    Allergies  (verified) Patient has no known allergies.   History: Past Medical History:  Diagnosis Date  . Allergy   . COPD (chronic obstructive pulmonary disease) (HCC)   . Cystocele   . Depression   . GERD (gastroesophageal reflux disease)   . Hematuria    microscopic  . Hemorrhoids   . History of recurrent UTIs   . Hypertension   . Insomnia   . Osteoarthritis    Past Surgical History:  Procedure Laterality Date  . ABDOMINAL HYSTERECTOMY    . COLONOSCOPY  07/07/08   repeat in 3 yrs Dr. Charm Barges  . CYSTOSCOPY  12/21/08   normal Dr. Aldean Ast   Family History  Problem Relation Age of Onset  . Arthritis Other   . Breast cancer Other   . Colon cancer Other    Social History   Socioeconomic History  . Marital status: Married    Spouse name: Not on file  . Number of children: Not on file  . Years of education: Not on file  . Highest education level: Not on file  Occupational History  . Not on file  Tobacco Use  . Smoking status: Never Smoker  . Smokeless tobacco: Never Used  Substance and Sexual Activity  . Alcohol use: Yes    Alcohol/week: 0.0 standard drinks    Comment: rare  . Drug use: No  . Sexual activity: Not on file  Other Topics Concern  . Not on file  Social History Narrative  . Not on file   Social Determinants of Health   Financial Resource Strain: Not on file  Food Insecurity:  Not on file  Transportation Needs: Not on file  Physical Activity: Not on file  Stress: Not on file  Social Connections: Not on file    Tobacco Counseling Counseling given: Not Answered   Clinical Intake:                 Diabetic?no         Activities of Daily Living No flowsheet data found.  Patient Care Team: Nelwyn Salisbury, MD as PCP - General  Indicate any recent Medical Services you may have received from other than [facility] providers in the past year (date may be approximate).     Assessment:   This is a routine wellness examination for  Stacey Melton.  Hearing/Vision screen No exam data present  Dietary issues and exercise activities discussed:    Goals   None    Depression Screen PHQ 2/9 Scores 10/19/2020 08/17/2020 05/03/2016  PHQ - 2 Score 0 3 0  PHQ- 9 Score - 14 -    Fall Risk Fall Risk  10/19/2020 08/17/2020 05/03/2016  Falls in the past year? 0 0 No  Number falls in past yr: 0 0 -  Injury with Fall? 0 0 -  Follow up Falls evaluation completed - -    FALL RISK PREVENTION PERTAINING TO THE HOME:  Any stairs in or around the home? No  If so, are there any without handrails? Yes  Home free of loose throw rugs in walkways, pet beds, electrical cords, etc? Yes  Adequate lighting in your home to reduce risk of falls? Yes   ASSISTIVE DEVICES UTILIZED TO PREVENT FALLS:  Life alert? No  Use of a cane, walker or w/c? No  Grab bars in the bathroom? Yes  Shower chair or bench in shower? Yes  Elevated toilet seat or a handicapped toilet? Yes     Cognitive Function:     Normal cognitive status assessed by direct observation by this Nurse Health Advisor. No abnormalities found.      Immunizations Immunization History  Administered Date(s) Administered  . Influenza Split 08/07/2011, 07/04/2012  . Influenza Whole 07/04/2007, 06/16/2008  . Influenza, High Dose Seasonal PF 09/28/2015  . Influenza,inj,Quad PF,6+ Mos 07/14/2013, 04/23/2018  . Influenza-Unspecified 06/26/2014  . PFIZER(Purple Top)SARS-COV-2 Vaccination 11/16/2019, 12/11/2019, 08/11/2020  . Pneumococcal Conjugate-13 04/23/2018  . Pneumococcal Polysaccharide-23 08/04/2007    TDAP status: Due, Education has been provided regarding the importance of this vaccine. Advised may receive this vaccine at local pharmacy or Health Dept. Aware to provide a copy of the vaccination record if obtained from local pharmacy or Health Dept. Verbalized acceptance and understanding.  Flu Vaccine status: Up to date  Pneumococcal vaccine status: Up to  date  Covid-19 vaccine status: Completed vaccines  Qualifies for Shingles Vaccine? Yes   Zostavax completed Yes   Shingrix Completed?: No.    Education has been provided regarding the importance of this vaccine. Patient has been advised to call insurance company to determine out of pocket expense if they have not yet received this vaccine. Advised may also receive vaccine at local pharmacy or Health Dept. Verbalized acceptance and understanding.  Screening Tests Health Maintenance  Topic Date Due  . TETANUS/TDAP  Never done  . DEXA SCAN  Never done  . INFLUENZA VACCINE  04/03/2021  . COVID-19 Vaccine  Completed  . PNA vac Low Risk Adult  Completed  . HPV VACCINES  Aged Out    Health Maintenance  Health Maintenance Due  Topic Date Due  .  TETANUS/TDAP  Never done  . DEXA SCAN  Never done    Colorectal cancer screening: No longer required.   Mammogram status: No longer required due to age.  Bone Density status: Ordered 12/13/2020. Pt provided with contact info and advised to call to schedule appt.  Lung Cancer Screening: (Low Dose CT Chest recommended if Age 45-80 years, 30 pack-year currently smoking OR have quit w/in 15years.) does not qualify.   Lung Cancer Screening Referral: n/a  Additional Screening:  Hepatitis C Screening: does not qualify  Vision Screening: Recommended annual ophthalmology exams for early detection of glaucoma and other disorders of the eye. Is the patient up to date with their annual eye exam?  Yes  Who is the provider or what is the name of the office in which the patient attends annual eye exams? [facility]  If pt is not established with a provider, would they like to be referred to a provider to establish care? No .   Dental Screening: Recommended annual dental exams for proper oral hygiene  Community Resource Referral / Chronic Care Management: CRR required this visit?  No   CCM required this visit?  No      Plan:     I  have personally reviewed and noted the following in the patient's chart:   . Medical and social history . Use of alcohol, tobacco or illicit drugs  . Current medications and supplements . Functional ability and status . Nutritional status . Physical activity . Advanced directives . List of other physicians . Hospitalizations, surgeries, and ER visits in previous 12 months . Vitals . Screenings to include cognitive, depression, and falls . Referrals and appointments  In addition, I have reviewed and discussed with patient certain preventive protocols, quality metrics, and best practice recommendations. A written personalized care plan for preventive services as well as general preventive health recommendations were provided to patient.     March Rummage, LPN   0/16/0109   Nurse Notes: none

## 2020-12-13 ENCOUNTER — Ambulatory Visit (INDEPENDENT_AMBULATORY_CARE_PROVIDER_SITE_OTHER): Payer: Medicare HMO

## 2020-12-13 DIAGNOSIS — Z0000 Encounter for general adult medical examination without abnormal findings: Secondary | ICD-10-CM

## 2020-12-13 NOTE — Patient Instructions (Signed)
Stacey Melton , Thank you for taking time to come for your Medicare Wellness Visit. I appreciate your ongoing commitment to your health goals. Please review the following plan we discussed and let me know if I can assist you in the future.   Screening recommendations/referrals: Colonoscopy: no longer required  Mammogram: no longer required Bone Density: order placed -pt declines  Recommended yearly ophthalmology/optometry visit for glaucoma screening and checkup Recommended yearly dental visit for hygiene and checkup  Vaccinations: Influenza vaccine: current due in fall 2022 Pneumococcal vaccine: completed series Tdap vaccine: due upon injury  Shingles vaccine: will obtain local pharmacy    Advanced directives: will provide copies   Conditions/risks identified: none  Next appointment: none   Preventive Care 65 Years and Older, Female Preventive care refers to lifestyle choices and visits with your health care provider that can promote health and wellness. What does preventive care include?  A yearly physical exam. This is also called an annual well check.  Dental exams once or twice a year.  Routine eye exams. Ask your health care provider how often you should have your eyes checked.  Personal lifestyle choices, including:  Daily care of your teeth and gums.  Regular physical activity.  Eating a healthy diet.  Avoiding tobacco and drug use.  Limiting alcohol use.  Practicing safe sex.  Taking low-dose aspirin every day.  Taking vitamin and mineral supplements as recommended by your health care provider. What happens during an annual well check? The services and screenings done by your health care provider during your annual well check will depend on your age, overall health, lifestyle risk factors, and family history of disease. Counseling  Your health care provider may ask you questions about your:  Alcohol use.  Tobacco use.  Drug use.  Emotional  well-being.  Home and relationship well-being.  Sexual activity.  Eating habits.  History of falls.  Memory and ability to understand (cognition).  Work and work Astronomer.  Reproductive health. Screening  You may have the following tests or measurements:  Height, weight, and BMI.  Blood pressure.  Lipid and cholesterol levels. These may be checked every 5 years, or more frequently if you are over 59 years old.  Skin check.  Lung cancer screening. You may have this screening every year starting at age 63 if you have a 30-pack-year history of smoking and currently smoke or have quit within the past 15 years.  Fecal occult blood test (FOBT) of the stool. You may have this test every year starting at age 38.  Flexible sigmoidoscopy or colonoscopy. You may have a sigmoidoscopy every 5 years or a colonoscopy every 10 years starting at age 61.  Hepatitis C blood test.  Hepatitis B blood test.  Sexually transmitted disease (STD) testing.  Diabetes screening. This is done by checking your blood sugar (glucose) after you have not eaten for a while (fasting). You may have this done every 1-3 years.  Bone density scan. This is done to screen for osteoporosis. You may have this done starting at age 50.  Mammogram. This may be done every 1-2 years. Talk to your health care provider about how often you should have regular mammograms. Talk with your health care provider about your test results, treatment options, and if necessary, the need for more tests. Vaccines  Your health care provider may recommend certain vaccines, such as:  Influenza vaccine. This is recommended every year.  Tetanus, diphtheria, and acellular pertussis (Tdap, Td) vaccine. You may need  a Td booster every 10 years.  Zoster vaccine. You may need this after age 75.  Pneumococcal 13-valent conjugate (PCV13) vaccine. One dose is recommended after age 5.  Pneumococcal polysaccharide (PPSV23) vaccine. One  dose is recommended after age 80. Talk to your health care provider about which screenings and vaccines you need and how often you need them. This information is not intended to replace advice given to you by your health care provider. Make sure you discuss any questions you have with your health care provider. Document Released: 09/16/2015 Document Revised: 05/09/2016 Document Reviewed: 06/21/2015 Elsevier Interactive Patient Education  2017 [place] Prevention in the Home Falls can cause injuries. They can happen to people of all ages. There are many things you can do to make your home safe and to help prevent falls. What can I do on the outside of my home?  Regularly fix the edges of walkways and driveways and fix any cracks.  Remove anything that might make you trip as you walk through a door, such as a raised step or threshold.  Trim any bushes or trees on the path to your home.  Use bright outdoor lighting.  Clear any walking paths of anything that might make someone trip, such as rocks or tools.  Regularly check to see if handrails are loose or broken. Make sure that both sides of any steps have handrails.  Any raised decks and porches should have guardrails on the edges.  Have any leaves, snow, or ice cleared regularly.  Use sand or salt on walking paths during winter.  Clean up any spills in your garage right away. This includes oil or grease spills. What can I do in the bathroom?  Use night lights.  Install grab bars by the toilet and in the tub and shower. Do not use towel bars as grab bars.  Use non-skid mats or decals in the tub or shower.  If you need to sit down in the shower, use a plastic, non-slip stool.  Keep the floor dry. Clean up any water that spills on the floor as soon as it happens.  Remove soap buildup in the tub or shower regularly.  Attach bath mats securely with double-sided non-slip rug tape.  Do not have throw rugs and other  things on the floor that can make you trip. What can I do in the bedroom?  Use night lights.  Make sure that you have a light by your bed that is easy to reach.  Do not use any sheets or blankets that are too big for your bed. They should not hang down onto the floor.  Have a firm chair that has side arms. You can use this for support while you get dressed.  Do not have throw rugs and other things on the floor that can make you trip. What can I do in the kitchen?  Clean up any spills right away.  Avoid walking on wet floors.  Keep items that you use a lot in easy-to-reach places.  If you need to reach something above you, use a strong step stool that has a grab bar.  Keep electrical cords out of the way.  Do not use floor polish or wax that makes floors slippery. If you must use wax, use non-skid floor wax.  Do not have throw rugs and other things on the floor that can make you trip. What can I do with my stairs?  Do not leave any items on the  stairs.  Make sure that there are handrails on both sides of the stairs and use them. Fix handrails that are broken or loose. Make sure that handrails are as long as the stairways.  Check any carpeting to make sure that it is firmly attached to the stairs. Fix any carpet that is loose or worn.  Avoid having throw rugs at the top or bottom of the stairs. If you do have throw rugs, attach them to the floor with carpet tape.  Make sure that you have a light switch at the top of the stairs and the bottom of the stairs. If you do not have them, ask someone to add them for you. What else can I do to help prevent falls?  Wear shoes that:  Do not have high heels.  Have rubber bottoms.  Are comfortable and fit you well.  Are closed at the toe. Do not wear sandals.  If you use a stepladder:  Make sure that it is fully opened. Do not climb a closed stepladder.  Make sure that both sides of the stepladder are locked into place.  Ask  someone to hold it for you, if possible.  Clearly mark and make sure that you can see:  Any grab bars or handrails.  First and last steps.  Where the edge of each step is.  Use tools that help you move around (mobility aids) if they are needed. These include:  Canes.  Walkers.  Scooters.  Crutches.  Turn on the lights when you go into a dark area. Replace any light bulbs as soon as they burn out.  Set up your furniture so you have a clear path. Avoid moving your furniture around.  If any of your floors are uneven, fix them.  If there are any pets around you, be aware of where they are.  Review your medicines with your doctor. Some medicines can make you feel dizzy. This can increase your chance of falling. Ask your doctor what other things that you can do to help prevent falls. This information is not intended to replace advice given to you by your health care provider. Make sure you discuss any questions you have with your health care provider. Document Released: 06/16/2009 Document Revised: 01/26/2016 Document Reviewed: 09/24/2014 Elsevier Interactive Patient Education  2017 [employer].

## 2021-01-16 DIAGNOSIS — H2511 Age-related nuclear cataract, right eye: Secondary | ICD-10-CM | POA: Diagnosis not present

## 2021-02-07 DIAGNOSIS — H2511 Age-related nuclear cataract, right eye: Secondary | ICD-10-CM | POA: Diagnosis not present

## 2021-02-07 DIAGNOSIS — H2512 Age-related nuclear cataract, left eye: Secondary | ICD-10-CM | POA: Diagnosis not present

## 2021-03-14 ENCOUNTER — Other Ambulatory Visit: Payer: Self-pay

## 2021-03-14 ENCOUNTER — Other Ambulatory Visit: Payer: Self-pay | Admitting: Family Medicine

## 2021-03-14 MED ORDER — AMLODIPINE BESYLATE 5 MG PO TABS: 5.0000 mg | ORAL_TABLET | Freq: Every day | ORAL | 3 refills | Status: DC

## 2021-03-14 MED ORDER — HYDROCHLOROTHIAZIDE 12.5 MG PO CAPS: 12.5000 mg | ORAL_CAPSULE | Freq: Every day | ORAL | 3 refills | Status: DC

## 2021-03-14 NOTE — Telephone Encounter (Signed)
Pt is calling in to make sure that Dr. Clent Ridges still want her to take Rx amlodipine (NORVASC) 5 MG she is out of it and she have a few of the hydrochlorothiazide (MICROZIDE) 12.5 MG   Pharm:  [facility].   Pt is calling a little confused wanting to know which medication that she should and should not be taking.  Pt is wanting to have a call back.

## 2021-07-18 ENCOUNTER — Telehealth: Payer: Self-pay

## 2021-07-18 NOTE — Telephone Encounter (Signed)
Last OV 12/06/20 for acute concern. AWV 12/13/20.  Pt declines to set up appt with PCP at this time; states she will call back.

## 2021-09-13 ENCOUNTER — Telehealth: Payer: Self-pay

## 2021-09-13 ENCOUNTER — Other Ambulatory Visit: Payer: Self-pay

## 2021-09-13 ENCOUNTER — Ambulatory Visit (INDEPENDENT_AMBULATORY_CARE_PROVIDER_SITE_OTHER): Payer: Medicare HMO

## 2021-09-13 ENCOUNTER — Ambulatory Visit (INDEPENDENT_AMBULATORY_CARE_PROVIDER_SITE_OTHER): Payer: Medicare HMO | Admitting: Family Medicine

## 2021-09-13 ENCOUNTER — Encounter: Payer: Self-pay | Admitting: Family Medicine

## 2021-09-13 VITALS — BP 128/84 | HR 82 | Temp 97.7°F | Wt 178.4 lb

## 2021-09-13 DIAGNOSIS — J449 Chronic obstructive pulmonary disease, unspecified: Secondary | ICD-10-CM

## 2021-09-13 DIAGNOSIS — R0989 Other specified symptoms and signs involving the circulatory and respiratory systems: Secondary | ICD-10-CM | POA: Diagnosis not present

## 2021-09-13 DIAGNOSIS — J189 Pneumonia, unspecified organism: Secondary | ICD-10-CM

## 2021-09-13 DIAGNOSIS — R059 Cough, unspecified: Secondary | ICD-10-CM | POA: Diagnosis not present

## 2021-09-13 DIAGNOSIS — R051 Acute cough: Secondary | ICD-10-CM | POA: Diagnosis not present

## 2021-09-13 DIAGNOSIS — J9811 Atelectasis: Secondary | ICD-10-CM | POA: Diagnosis not present

## 2021-09-13 LAB — POCT INFLUENZA A/B
Influenza A, POC: NEGATIVE
Influenza B, POC: NEGATIVE

## 2021-09-13 LAB — POC COVID19 BINAXNOW: SARS Coronavirus 2 Ag: NEGATIVE

## 2021-09-13 MED ORDER — ALBUTEROL SULFATE HFA 108 (90 BASE) MCG/ACT IN AERS: 2.0000 | INHALATION_SPRAY | RESPIRATORY_TRACT | 2 refills | Status: DC | PRN

## 2021-09-13 MED ORDER — CEFTRIAXONE SODIUM 1 G IJ SOLR
1.0000 g | Freq: Once | INTRAMUSCULAR | Status: AC
  Administered 2021-09-13: 1 g via INTRAMUSCULAR

## 2021-09-13 MED ORDER — DOXYCYCLINE HYCLATE 100 MG PO CAPS: 100.0000 mg | ORAL_CAPSULE | Freq: Two times a day (BID) | ORAL | 0 refills | Status: AC

## 2021-09-13 MED ORDER — IPRATROPIUM-ALBUTEROL 0.5-2.5 (3) MG/3ML IN SOLN
3.0000 mL | Freq: Once | RESPIRATORY_TRACT | Status: AC
  Administered 2021-09-13: 3 mL via RESPIRATORY_TRACT

## 2021-09-13 MED ORDER — METHYLPREDNISOLONE 4 MG PO TBPK: ORAL_TABLET | ORAL | 0 refills | Status: DC

## 2021-09-13 NOTE — Telephone Encounter (Signed)
Patient calling in with respiratory symptoms: Shortness of breath, chest pain, palpitations or other red words send to Triage  Does the patient have a fever over 100, cough, congestion, sore throat, runny nose, lost of taste/smell (please list symptoms that patient has)?Yes a cough, runny nose.   What date did symptoms start?09/08/2021 (If over 5 days ago, pt may be scheduled for in person visit)  Have you tested for Covid in the last 5 days? No   If yes, was it positive []  OR negative [] ? If positive in the last 5 days, please schedule virtual visit now. If negative, schedule for an in person OV with the next available provider if PCP has no openings. Please also let patient know they will be tested again (follow the script below)  "you will have to arrive prior to your appt time to be Covid tested. Please park in back of office at the cone & call [phone] to let the staff know you have arrived. A staff member will meet you at your car to do a rapid covid test. Once the test has resulted you will be notified by phone of your results to determine if appt will remain an in person visit or be converted to a virtual/phone visit. If you arrive less than before your appt time, your visit will be automatically converted to virtual & any recommended testing will happen AFTER the visit."   THINGS TO REMEMBER  If no availability for virtual visit in office,  please schedule another [place] office  If no availability at another  office, please instruct patient that they can schedule an evisit or virtual visit through their mychart account. Visits up to 8pm  patients can be seen in office 5 days after positive COVID test

## 2021-09-13 NOTE — Addendum Note (Signed)
Addended by: Carola Rhine on: 09/13/2021 04:51 PM   Modules accepted: Orders

## 2021-09-13 NOTE — Progress Notes (Signed)
° °  Subjective:    Patient ID: Stacey Melton, female    DOB: 07/18/1933, 88 y.o.   MRN: [id number]  HPI Here with her daughter Harvie Bridge ( at [phone]) for progressive coughing and SOB. This began about 6 days ago with a mild cough. Over the past few days the cough has gotten much worse. It is usually non-productive. She has felt more SOB than usual and she has been wheezing. No fever or chest pain. She has a mild ST and stuffy nose. No body aches or NVD. She has tested negative here today for Covid-19 and influenza.    Review of Systems  Constitutional: Negative.   HENT:  Positive for congestion and sore throat. Negative for ear pain and sinus pain.   Eyes: Negative.   Respiratory:  Positive for cough, chest tightness, shortness of breath and wheezing.   Cardiovascular: Negative.   Gastrointestinal: Negative.       Objective:   Physical Exam Constitutional:      Appearance: She is obese.     Comments: Coughing frequently, able to walk slowly without assistance   HENT:     Right Ear: Tympanic membrane, ear canal and external ear normal.     Left Ear: Tympanic membrane, ear canal and external ear normal.     Nose: Nose normal.     Mouth/Throat:     Pharynx: Oropharynx is clear.  Eyes:     Conjunctiva/sclera: Conjunctivae normal.  Cardiovascular:     Rate and Rhythm: Normal rate and regular rhythm.     Pulses: Normal pulses.     Heart sounds: Normal heart sounds.  Pulmonary:     Effort: Pulmonary effort is normal. No respiratory distress.     Breath sounds: No stridor. Wheezing present.     Comments: There are rales in the right upper lobe  Lymphadenopathy:     Cervical: No cervical adenopathy.  Neurological:     Mental Status: She is alert.          Assessment & Plan:  CAP in the RUL. This is causing an acute exacerbation of her COPD. She was given a nebulization treatment with DuoNeb and a shot of Rocephin. We will get a CXR today. She is sent home with 10  days of Doxycycline, a Medrol dose pack, and an albuterol inhaler. We will follow up with her tomorrow. We spent a total of ( 35  ) minutes reviewing records and discussing these issues.  Gershon Crane, MD

## 2021-09-15 ENCOUNTER — Telehealth: Payer: Self-pay

## 2021-09-15 NOTE — Telephone Encounter (Signed)
Spoke with pt daughter stated that pt feeling today better than yesterday but still coughing, advised of Dr Sarajane Jews advise, pt daughter verbalized understanding

## 2021-09-15 NOTE — Telephone Encounter (Signed)
Tell her to be sure to take 2 puffs of the inhaler every 4 hours. If her oxygen sats drop below 80%, definitely take her to the ED

## 2021-09-21 NOTE — Progress Notes (Signed)
Spoke with pt advised of Dr Clent Ridges recommendation from a separate telephone note, verbalized understanding

## 2021-10-11 ENCOUNTER — Encounter: Payer: Self-pay | Admitting: Family Medicine

## 2021-10-11 ENCOUNTER — Ambulatory Visit (INDEPENDENT_AMBULATORY_CARE_PROVIDER_SITE_OTHER): Payer: Medicare HMO | Admitting: Family Medicine

## 2021-10-11 VITALS — BP 128/76 | HR 64 | Temp 98.7°F | Ht 65.0 in | Wt 182.0 lb

## 2021-10-11 DIAGNOSIS — J449 Chronic obstructive pulmonary disease, unspecified: Secondary | ICD-10-CM

## 2021-10-11 DIAGNOSIS — I10 Essential (primary) hypertension: Secondary | ICD-10-CM | POA: Diagnosis not present

## 2021-10-11 DIAGNOSIS — R413 Other amnesia: Secondary | ICD-10-CM | POA: Diagnosis not present

## 2021-10-11 DIAGNOSIS — R739 Hyperglycemia, unspecified: Secondary | ICD-10-CM

## 2021-10-11 DIAGNOSIS — M153 Secondary multiple arthritis: Secondary | ICD-10-CM | POA: Diagnosis not present

## 2021-10-11 DIAGNOSIS — E538 Deficiency of other specified B group vitamins: Secondary | ICD-10-CM | POA: Diagnosis not present

## 2021-10-11 DIAGNOSIS — K219 Gastro-esophageal reflux disease without esophagitis: Secondary | ICD-10-CM | POA: Diagnosis not present

## 2021-10-11 LAB — HEPATIC FUNCTION PANEL
ALT: 11 U/L (ref 0–35)
AST: 19 U/L (ref 0–37)
Albumin: 4.1 g/dL (ref 3.5–5.2)
Alkaline Phosphatase: 50 U/L (ref 39–117)
Bilirubin, Direct: 0.1 mg/dL (ref 0.0–0.3)
Total Bilirubin: 0.9 mg/dL (ref 0.2–1.2)
Total Protein: 7.2 g/dL (ref 6.0–8.3)

## 2021-10-11 LAB — LIPID PANEL
Cholesterol: 222 mg/dL — ABNORMAL HIGH (ref 0–200)
HDL: 56.4 mg/dL (ref >39.00)
LDL Cholesterol: 145 mg/dL — ABNORMAL HIGH (ref 0–99)
NonHDL: 166.05
Total CHOL/HDL Ratio: 4
Triglycerides: 105 mg/dL (ref 0.0–149.0)
VLDL: 21 mg/dL (ref 0.0–40.0)

## 2021-10-11 LAB — CBC WITH DIFFERENTIAL/PLATELET
Basophils Absolute: 0 10*3/uL (ref 0.0–0.1)
Basophils Relative: 0.4 % (ref 0.0–3.0)
Eosinophils Absolute: 0.2 10*3/uL (ref 0.0–0.7)
Eosinophils Relative: 3.3 % (ref 0.0–5.0)
HCT: 44.4 % (ref 36.0–46.0)
Hemoglobin: 14.5 g/dL (ref 12.0–15.0)
Lymphocytes Relative: 15.1 % (ref 12.0–46.0)
Lymphs Abs: 0.8 10*3/uL (ref 0.7–4.0)
MCHC: 32.6 g/dL (ref 30.0–36.0)
MCV: 86.3 fl (ref 78.0–100.0)
Monocytes Absolute: 0.6 10*3/uL (ref 0.1–1.0)
Monocytes Relative: 10.7 % (ref 3.0–12.0)
Neutro Abs: 3.9 10*3/uL (ref 1.4–7.7)
Neutrophils Relative %: 70.5 % (ref 43.0–77.0)
Platelets: 212 10*3/uL (ref 150.0–400.0)
RBC: 5.14 Mil/uL — ABNORMAL HIGH (ref 3.87–5.11)
RDW: 14.8 % (ref 11.5–15.5)
WBC: 5.5 10*3/uL (ref 4.0–10.5)

## 2021-10-11 LAB — BASIC METABOLIC PANEL
BUN: 16 mg/dL (ref 6–23)
CO2: 29 mEq/L (ref 19–32)
Calcium: 9.9 mg/dL (ref 8.4–10.5)
Chloride: 100 mEq/L (ref 96–112)
Creatinine, Ser: 0.91 mg/dL (ref 0.40–1.20)
GFR: 56.99 mL/min — ABNORMAL LOW (ref >60.00)
Glucose, Bld: 90 mg/dL (ref 70–99)
Potassium: 4.2 mEq/L (ref 3.5–5.1)
Sodium: 139 mEq/L (ref 135–145)

## 2021-10-11 LAB — VITAMIN B12: Vitamin B-12: 256 pg/mL (ref 211–911)

## 2021-10-11 LAB — TSH: TSH: 1.22 u[IU]/mL (ref 0.35–5.50)

## 2021-10-11 LAB — HEMOGLOBIN A1C: Hgb A1c MFr Bld: 5.5 % (ref 4.6–6.5)

## 2021-10-11 MED ORDER — DONEPEZIL HCL 5 MG PO TABS: 5.0000 mg | ORAL_TABLET | Freq: Every day | ORAL | 0 refills | Status: DC

## 2021-10-11 NOTE — Addendum Note (Signed)
Addended by: Gershon Crane A on: 10/11/2021 02:12 PM   Modules accepted: Orders

## 2021-10-11 NOTE — Progress Notes (Signed)
Subjective:    Patient ID: Stacey Melton, female    DOB: 1933/06/28, 88 y.o.   MRN: [id number]  HPI Here with her daughter Remer Macho (at [phone]) to follow up on issues. She feels well physically and she remains active. She does her own house work and yard work, and she still drives herself. Her COPD only bothers her when she exerts herself. Her BP is stable. Her OA is stable. She does mention a problem with memory loss however. This is mild and has not inhibited her activities yet, but she finds herself forgetting to do things, repeating herself in conversations, etc.    Review of Systems  Constitutional: Negative.   HENT: Negative.    Eyes: Negative.   Respiratory: Negative.    Cardiovascular: Negative.   Gastrointestinal: Negative.   Genitourinary:  Negative for decreased urine volume, difficulty urinating, dyspareunia, dysuria, enuresis, flank pain, frequency, hematuria, pelvic pain and urgency.  Musculoskeletal:  Positive for arthralgias.  Skin: Negative.   Neurological: Negative.  Negative for headaches.  Psychiatric/Behavioral: Negative.        Objective:   Physical Exam Constitutional:      General: She is not in acute distress.    Appearance: Normal appearance. She is well-developed.  HENT:     Head: Normocephalic and atraumatic.     Right Ear: External ear normal.     Left Ear: External ear normal.     Nose: Nose normal.     Mouth/Throat:     Pharynx: No oropharyngeal exudate.  Eyes:     General: No scleral icterus.    Conjunctiva/sclera: Conjunctivae normal.     Pupils: Pupils are equal, round, and reactive to light.  Neck:     Thyroid: No thyromegaly.     Vascular: No JVD.  Cardiovascular:     Rate and Rhythm: Normal rate and regular rhythm.     Heart sounds: Normal heart sounds. No murmur heard.   No friction rub. No gallop.  Pulmonary:     Effort: Pulmonary effort is normal. No respiratory distress.     Breath sounds: Normal breath  sounds. No wheezing or rales.  Chest:     Chest wall: No tenderness.  Abdominal:     General: Bowel sounds are normal. There is no distension.     Palpations: Abdomen is soft. There is no mass.     Tenderness: There is no abdominal tenderness. There is no guarding or rebound.  Musculoskeletal:        General: No tenderness. Normal range of motion.     Cervical back: Normal range of motion and neck supple.  Lymphadenopathy:     Cervical: No cervical adenopathy.  Skin:    General: Skin is warm and dry.     Findings: No erythema or rash.  Neurological:     Mental Status: She is alert and oriented to person, place, and time.     Cranial Nerves: No cranial nerve deficit.     Motor: No abnormal muscle tone.     Coordination: Coordination normal.     Deep Tendon Reflexes: Reflexes are normal and symmetric. Reflexes normal.  Psychiatric:        Behavior: Behavior normal.        Thought Content: Thought content normal.        Judgment: Judgment normal.          Assessment & Plan:  She is doing well as far as OA and HTN. For the memory loss, she  will try Aricept 5 mg daily. She will report back to [place] in 30 days. For the COPD, we weill refer her to Pulmonology. Get fasting labs to check lipids, etc. Check a B12 in case this is contributing to the memory loss. We spent a total of ( 33  ) minutes reviewing records and discussing these issues.  Alysia Penna, MD

## 2021-10-19 ENCOUNTER — Telehealth (INDEPENDENT_AMBULATORY_CARE_PROVIDER_SITE_OTHER): Payer: Medicare HMO | Admitting: Family Medicine

## 2021-10-19 ENCOUNTER — Telehealth: Payer: Self-pay | Admitting: Family Medicine

## 2021-10-19 ENCOUNTER — Telehealth: Payer: Self-pay | Admitting: *Deleted

## 2021-10-19 ENCOUNTER — Encounter: Payer: Self-pay | Admitting: Family Medicine

## 2021-10-19 DIAGNOSIS — R059 Cough, unspecified: Secondary | ICD-10-CM

## 2021-10-19 DIAGNOSIS — R0981 Nasal congestion: Secondary | ICD-10-CM | POA: Diagnosis not present

## 2021-10-19 MED ORDER — ALBUTEROL SULFATE HFA 108 (90 BASE) MCG/ACT IN AERS: 2.0000 | INHALATION_SPRAY | Freq: Four times a day (QID) | RESPIRATORY_TRACT | 0 refills | Status: DC | PRN

## 2021-10-19 MED ORDER — BENZONATATE 100 MG PO CAPS: ORAL_CAPSULE | ORAL | 0 refills | Status: DC

## 2021-10-19 NOTE — Progress Notes (Signed)
Virtual Visit via Telephone Note  I connected with Stacey Melton on 10/19/21 at  3:20 PM EST by telephone and verified that I am speaking with the correct person using two identifiers.   I discussed the limitations of performing an evaluation and management service by telephone and requested permission for a phone visit. The patient expressed understanding and agreed to proceed.  Location patient:  [place] Location provider: work or home office Participants present for the call: patient, provider Patient did not have a visit with me in the prior 7 days to address this/these issue(s).   History of Present Illness:  Acute telemedicine visit for cough and congestion: -Onset: 2 days ago -Symptoms include: cough, nasal congestion - has been outside -Denies:fevers, NVD, CP, body aches -Has tried:none -Pertinent past medical history: see below, has a history of bronchitis and uses inhalers at times but out of her inhaler -Pertinent medication allergies: No Known Allergies -COVID-19 vaccine status: Immunization History  Administered Date(s) Administered   Influenza Split 08/07/2011, 07/04/2012   Influenza Whole 07/04/2007, 06/16/2008   Influenza, High Dose Seasonal PF 09/28/2015   Influenza,inj,Quad PF,6+ Mos 07/14/2013, 04/23/2018   Influenza-Unspecified 06/26/2014   PFIZER(Purple Top)SARS-COV-2 Vaccination 11/16/2019, 12/11/2019, 08/11/2020   Pneumococcal Conjugate-13 04/23/2018   Pneumococcal Polysaccharide-23 08/04/2007      Past Medical History:  Diagnosis Date   Allergy    COPD (chronic obstructive pulmonary disease) (HCC)    Cystocele    Depression    GERD (gastroesophageal reflux disease)    Hematuria    microscopic   Hemorrhoids    History of recurrent UTIs    Hypertension    Insomnia    Osteoarthritis     Current Outpatient Medications on File Prior to Visit  Medication Sig Dispense Refill   amLODipine (NORVASC) 5 MG tablet Take 1 tablet (5 mg total) by mouth  daily. 90 tablet 3   donepezil (ARICEPT) 5 MG tablet Take 1 tablet (5 mg total) by mouth at bedtime. 30 tablet 0   No current facility-administered medications on file prior to visit.    Observations/Objective: Patient sounds cheerful and well on the phone. I do not appreciate any SOB. Speech and thought processing are grossly intact. Patient reported vitals:  Assessment and Plan:  Nasal congestion  Cough, unspecified type  -we discussed possible serious and likely etiologies, options for evaluation and workup, limitations of telemedicine visit vs in person visit, treatment, treatment risks and precautions. Pt prefers to treat via telemedicine empirically rather than in person at this moment. Query VURI, Allergic rhinitis, possible mild covid 19 symptoms vs other. Advised covid testing q 48 x 2-3, allegra once daily, sent rx for tessalon and alb. Advise of options for treatment if covid positive - can schedule virtual visit follow up or contact a [facility] pharmacy (number provided. ) Advised to seek prompt virtual visit or in person care if worsening, new symptoms arise, or if is not improving with treatment as expected per our conversation of expected course. Discussed options for follow up care. Did let this patient know that I do telemedicine on Tuesdays and Thursdays for [facility] and those are the days I am logged into the system. Advised to schedule follow up visit with PCP, [place] virtual visits or [facility] if any further questions or concerns to avoid delays in care.   I discussed the assessment and treatment plan with the patient. The patient was provided an opportunity to ask questions and all were answered. The patient agreed with the plan and  demonstrated an understanding of the instructions.    Follow Up Instructions:  I did not refer this patient for an OV with me in the next 24 hours for this/these issue(s).  I discussed the assessment and treatment plan with the patient.  The patient was provided an opportunity to ask questions and all were answered. The patient agreed with the plan and demonstrated an understanding of the instructions.   I spent 26 minutes on the date of this visit in the care of this patient. See summary of tasks completed to properly care for this patient in the detailed notes above which also included counseling of above, review of PMH, medications, allergies, evaluation of the patient and ordering and/or  instructing patient on testing and care options.     Lucretia Kern, DO

## 2021-10-19 NOTE — Telephone Encounter (Signed)
During the check-in process for the virtual visit today with Dr Selena Batten, patient stated she is not taking Hydrochlorothiazide 12.5mg  daily as this was on the medication list.  Message sent to PCP to confirm for the patient if she should be taking this as she states she is confused and had this marked out?

## 2021-10-19 NOTE — Telephone Encounter (Signed)
Patient calling in with respiratory symptoms: Shortness of breath, chest pain, palpitations or other red words send to Triage  Does the patient have a fever over 100, cough, congestion, sore throat, runny nose, lost of taste/smell (please list symptoms that patient has)?cold, cough and chest congestion  What date did symptoms start?10-17-2021 (If over 5 days ago, pt may be scheduled for in person visit)  Have you tested for Covid in the last 5 days? No   If yes, was it positive []  OR negative [] ? If positive in the last 5 days, please schedule virtual visit now. If negative, schedule for an in person OV with the next available provider if PCP has no openings. Please also let patient know they will be tested again (follow the script below)  "you will have to arrive prior to your appt time to be Covid tested. Please park in back of office at the cone & call [phone] to let the staff know you have arrived. A staff member will meet you at your car to do a rapid covid test. Once the test has resulted you will be notified by phone of your results to determine if appt will remain an in person visit or be converted to a virtual/phone visit. If you arrive less than before your appt time, your visit will be automatically converted to virtual & any recommended testing will happen AFTER the visit." Pt has virtual with dr [phone] 10-19-2021 at 320pm  THINGS TO REMEMBER  If no availability for virtual visit in office,  please schedule another [place] office  If no availability at another [place] office, please instruct patient that they can schedule an evisit or virtual visit through their mychart account. Visits up to 8pm  patients can be seen in office 5 days after positive COVID test

## 2021-10-19 NOTE — Patient Instructions (Addendum)
°  HOME CARE TIPS:  -COVID19 testing information: [email or web address]  Most pharmacies also offer testing and home test kits. If the Covid19 test is positive and you desire antiviral treatment, please contact a [facility] pharmacy or schedule a follow up virtual visit through your primary care office or through the [employer].  Other test to treat options: http://www.vasquez-vaughn.[email or web address]_[email or web address]  -I sent the medication(s) we discussed to your pharmacy: Meds ordered this encounter  Medications   benzonatate (TESSALON PERLES) 100 MG capsule    Sig: 1-2 capsules up to twice daily as needed for cough    Dispense:  30 capsule    Refill:  0   albuterol (PROAIR HFA) 108 (90 Base) MCG/ACT inhaler    Sig: Inhale 2 puffs into the lungs every 6 (six) hours as needed for wheezing or shortness of breath.    Dispense:  1 each    Refill:  0   Allegra once daily  I hope you are feeling better soon!  Seek in person care promptly if your symptoms worsen, new concerns arise or you are not improving with treatment.  It was nice to meet you today. I help [place] out with telemedicine visits on Tuesdays and Thursdays and am happy to help if you need a virtual follow up visit on those days. Otherwise, if you have any concerns or questions following this visit please schedule a follow up visit with your Primary Care office or seek care at a local urgent care clinic to avoid delays in care

## 2021-10-20 NOTE — Telephone Encounter (Signed)
That is correct, she should not be taking HCTZ

## 2021-10-23 NOTE — Telephone Encounter (Signed)
Spoke with patient.  Message complete.  

## 2021-10-24 ENCOUNTER — Telehealth: Payer: Self-pay

## 2021-10-24 NOTE — Telephone Encounter (Signed)
--  Caller states her mother was treated for pneumonia 2 weeks ago and finished her antibiotics. Caller called her mother and she is wheezing occasionally. Caller is not with her mother.   10/22/2021 2:14:34 PM See PCP within 24 Hours Durwin Nora, Charity fundraiser, [employer]

## 2021-10-24 NOTE — Telephone Encounter (Signed)
Pt states she believes that she's getting better. States meds prescribed by Dr Selena Batten have been effective. Cough is productive without fever or SOB. Pt cannot remember if she took a covid test as recommended during VV with Dr Selena Batten on 2/16. Pt advised that if she has questions about her symptoms or if she develops SOB/fever, she needs to call to schedule appt with provider. Pt verb understanding. No appt scheduled at this time.

## 2021-10-24 NOTE — Telephone Encounter (Signed)
FYI

## 2021-11-13 ENCOUNTER — Telehealth: Payer: Self-pay | Admitting: Family Medicine

## 2021-11-13 NOTE — Telephone Encounter (Signed)
Pt is calling and she has about 1/2 bottle of  hctz left  with expiration date 10-06-2021 and would like to know if she suppose to be taking that medication ?

## 2021-11-14 NOTE — Telephone Encounter (Signed)
As we told her on 10-19-21, she should NOT be taking that medication. Please get rid of it   ?

## 2021-11-15 ENCOUNTER — Institutional Professional Consult (permissible substitution): Payer: Medicare HMO | Admitting: Pulmonary Disease

## 2021-11-15 ENCOUNTER — Telehealth: Payer: Self-pay | Admitting: Family Medicine

## 2021-11-15 NOTE — Telephone Encounter (Signed)
To be honest, I am not sure why this was stopped either. Earlier I had briefly looked at the note in her chart about her video visit with Dr. Selena Batten on 10-19-21 about her coughing. The final summary had the HCTZ crossed out as if it had been stopped. That is why I said to stay off of it. However now as I read the full note from that visit, there is no mention of this. I guess the real question now is does she need it? That all depends on her BP. If this well controlled without the HCTZ, then I would recommend staying off it. However I fher BP goes up, we should start back on it. Have them monitor the BP for one week and then report back to [place].  ?

## 2021-11-15 NOTE — Telephone Encounter (Signed)
Patient daughter Elease Hashimoto called in requesting to speak to someone to receive clarification on what medication her mother should be taking now. ? ?Elease Hashimoto could  be contacted at [phone]. ? ?Please advise. ?

## 2021-11-15 NOTE — Telephone Encounter (Signed)
Spoke with patients daughter Elease Hashimoto about medications.     Daughter wanted to know the reasoning behind why her mom is no longer taking HCTZ.   I looked back at notes was unable to find reason.   Please advise ?

## 2021-11-15 NOTE — Telephone Encounter (Signed)
See previous message from 11/14/21 ?

## 2021-11-15 NOTE — Telephone Encounter (Signed)
Called patient discussed message. She stated that she has gotten rid of the prescription of HCTZ.   Voiced understanding. ?

## 2021-11-15 NOTE — Telephone Encounter (Signed)
Spoke with Stacey Melton Pt daughter who is listed on pt DPR, advised of Dr Sarajane Jews recommendation, stated that pt BP reading are good without taking medication. Verbalized understanding ?

## 2021-12-11 ENCOUNTER — Telehealth: Payer: Self-pay | Admitting: Family Medicine

## 2021-12-11 NOTE — Telephone Encounter (Signed)
Pt Daughter called back and stated that she doesn't believe her mom is ready to sched her AWV at this time since she just did a physical with Dr. Clent Ridges. She is aware the AWV and physical are two different things.  ?

## 2021-12-11 NOTE — Telephone Encounter (Signed)
Left message for patient to call back and schedule Medicare Annual Wellness Visit (AWV) either virtually or in office. Left  my jabber number [phone] ? ? ?Last AWV 12/13/20 ?please schedule at anytime with [facility] Nurse Health Advisor 1 or 2 ? ? ? ?

## 2021-12-14 ENCOUNTER — Telehealth: Payer: Self-pay

## 2021-12-14 NOTE — Telephone Encounter (Signed)
Unsuccessful attempt to reach patient on preferred number listed in notes for scheduled AWV. Left message on voicemail okay to reschedule. 

## 2021-12-19 ENCOUNTER — Telehealth: Payer: Self-pay | Admitting: Family Medicine

## 2021-12-19 NOTE — Telephone Encounter (Signed)
Spoke with patient to schedule Medicare Annual Wellness Visit (AWV) either virtually or in office. Left  my jabber number [phone] ? ?She stated she will call back to schedule  ? ? ?Last AWV ;12/13/20 ?please schedule at anytime with [facility] Nurse Health Advisor 1 or 2 ? ? ? ?

## 2022-02-19 ENCOUNTER — Telehealth: Payer: Self-pay | Admitting: Family Medicine

## 2022-02-19 NOTE — Telephone Encounter (Signed)
Spoke with patient to schedule Medicare Annual Wellness Visit (AWV) either virtually or in office.   Last AWV 12/13/20 ; please schedule at anytime with [facility] Nurse Health Advisor 1 or 2   Patient didn't want to schedule at this time call back later

## 2022-03-23 ENCOUNTER — Telehealth: Payer: Self-pay | Admitting: Family Medicine

## 2022-03-23 NOTE — Telephone Encounter (Signed)
Tried calling patient to schedule Medicare Annual Wellness Visit (AWV) either virtually or in office. Left  my Zachery Conch number [phone] No answer   Last AWV 09/14/20 ; please schedule at anytime with [facility] Nurse Health Advisor 1 or 2

## 2022-03-26 ENCOUNTER — Telehealth: Payer: Self-pay | Admitting: Family Medicine

## 2022-03-26 NOTE — Telephone Encounter (Signed)
Last OV-10/11/21.    Please advise if patient should continue with medication

## 2022-03-26 NOTE — Telephone Encounter (Signed)
Pt called to request a refill of the:  amLOamLODipine (NORVASC) 5 MG tabletDipine (NORVASC) 5 MG tablet  Pt stated her pharmacy recently closed and needs to call back with new pharmacy information.  Also, Pt wants to ask if she still needs to take these meds?  Please advise.

## 2022-03-27 ENCOUNTER — Other Ambulatory Visit: Payer: Self-pay

## 2022-03-27 DIAGNOSIS — I10 Essential (primary) hypertension: Secondary | ICD-10-CM

## 2022-03-27 MED ORDER — AMLODIPINE BESYLATE 5 MG PO TABS: 5.0000 mg | ORAL_TABLET | Freq: Every day | ORAL | 3 refills | Status: DC

## 2022-03-27 NOTE — Telephone Encounter (Signed)
Please refill this for one year  

## 2022-03-27 NOTE — Telephone Encounter (Signed)
Patient aware refills sent to [facility].

## 2022-03-29 ENCOUNTER — Ambulatory Visit (INDEPENDENT_AMBULATORY_CARE_PROVIDER_SITE_OTHER): Payer: Medicare HMO

## 2022-03-29 VITALS — Ht 65.0 in | Wt 182.0 lb

## 2022-03-29 DIAGNOSIS — Z0000 Encounter for general adult medical examination without abnormal findings: Secondary | ICD-10-CM | POA: Diagnosis not present

## 2022-03-29 NOTE — Progress Notes (Signed)
Subjective:   Stacey Melton is a 88 y.o. female who presents for Medicare Annual (Subsequent) preventive examination.  Review of Systems    Virtual Visit via Telephone Note  I connected with  Stacey Melton on 03/29/22 at 12:30 PM EDT by telephone and verified that I am speaking with the correct person using two identifiers.  Location: Patient: Home Provider: Office Persons participating in the virtual visit: patient/Nurse Health Advisor   I discussed the limitations, risks, security and privacy concerns of performing an evaluation and management service by telephone and the availability of in person appointments. The patient expressed understanding and agreed to proceed.  Interactive audio and video telecommunications were attempted between this nurse and patient, however failed, due to patient having technical difficulties OR patient did not have access to video capability.  We continued and completed visit with audio only.  Some vital signs may be absent or patient reported.   Tillie Rung, LPN  Cardiac Risk Factors include: advanced age (>53men, >35 women);hypertension     Objective:    Today's Vitals   03/29/22 1234  Weight: 182 lb (82.6 kg)  Height: 5\' 5"  (1.651 m)   Body mass index is 30.29 kg/m.     03/29/2022   12:40 PM 12/13/2020    1:43 PM 02/02/2015   12:34 PM  Advanced Directives  Does Patient Have a Medical Advance Directive? Yes Yes No  Type of 04/04/2015 of [place];Living will Healthcare Power of [place];Living will   Does patient want to make changes to medical advance directive? No - Patient declined No - Patient declined   Copy of Healthcare Power of Attorney in Chart? No - copy requested No - copy requested   Would patient like information on creating a medical advance directive?   No - patient declined information    Current Medications (verified) Outpatient Encounter Medications as of 03/29/2022  Medication Sig    albuterol (PROAIR HFA) 108 (90 Base) MCG/ACT inhaler Inhale 2 puffs into the lungs every 6 (six) hours as needed for wheezing or shortness of breath.   amLODipine (NORVASC) 5 MG tablet Take 1 tablet (5 mg total) by mouth daily.   benzonatate (TESSALON PERLES) 100 MG capsule 1-2 capsules up to twice daily as needed for cough   donepezil (ARICEPT) 5 MG tablet Take 1 tablet (5 mg total) by mouth at bedtime.   No facility-administered encounter medications on file as of 03/29/2022.    Allergies (verified) Patient has no known allergies.   History: Past Medical History:  Diagnosis Date   Allergy    COPD (chronic obstructive pulmonary disease) (HCC)    Cystocele    Depression    GERD (gastroesophageal reflux disease)    Hematuria    microscopic   Hemorrhoids    History of recurrent UTIs    Hypertension    Insomnia    Osteoarthritis    Past Surgical History:  Procedure Laterality Date   ABDOMINAL HYSTERECTOMY     COLONOSCOPY  07/07/08   repeat in 3 yrs Dr. 13/4/09   CYSTOSCOPY  12/21/08   normal Dr. 12/23/08   Family History  Problem Relation Age of Onset   Arthritis Other    Breast cancer Other    Colon cancer Other    Social History   Socioeconomic History   Marital status: Married    Spouse name: Not on file   Number of children: Not on file   Years of education: Not on file  Highest education level: Not on file  Occupational History   Not on file  Tobacco Use   Smoking status: Never   Smokeless tobacco: Never  Substance and Sexual Activity   Alcohol use: Yes    Alcohol/week: 0.0 standard drinks of alcohol    Comment: rare   Drug use: No   Sexual activity: Not on file  Other Topics Concern   Not on file  Social History Narrative   Not on file   Social Determinants of Health   Financial Resource Strain: Low Risk  (03/29/2022)   Overall Financial Resource Strain (CARDIA)    Difficulty of Paying Living Expenses: Not hard at all  Food Insecurity: No Food  Insecurity (03/29/2022)   Hunger Vital Sign    Worried About Running Out of Food in the Last Year: Never true    Ran Out of Food in the Last Year: Never true  Transportation Needs: No Transportation Needs (03/29/2022)   PRAPARE - Administrator, Civil Service (Medical): No    Lack of Transportation (Non-Medical): No  Physical Activity: Inactive (03/29/2022)   Exercise Vital Sign    Days of Exercise per Week: 0 days    Minutes of Exercise per Session: 0 min  Stress: No Stress Concern Present (03/29/2022)   [employer] of Occupational Health - Occupational Stress Questionnaire    Feeling of Stress : Not at all  Social Connections: Moderately Integrated (03/29/2022)   Social Connection and Isolation Panel [NHANES]    Frequency of Communication with Friends and Family: More than three times a week    Frequency of Social Gatherings with Friends and Family: More than three times a week    Attends Religious Services: More than 4 times per year    Active Member of [employer] or Organizations: Yes    Attends Banker Meetings: More than 4 times per year    Marital Status: Widowed    Tobacco Counseling Counseling given: Not Answered   Clinical Intake:  Pre-visit preparation completed: NoHow often do you need to have someone help you when you read instructions, pamphlets, or other written materials from your doctor or pharmacy?: 1 - Never  Diabetic?  No  Interpreter Needed?: No   Activities of Daily Living    03/29/2022   12:39 PM  In your present state of health, do you have any difficulty performing the following activities:  Hearing? 0  Vision? 0  Difficulty concentrating or making decisions? 0  Walking or climbing stairs? 0  Dressing or bathing? 0  Doing errands, shopping? 0  Preparing Food and eating ? N  Using the Toilet? N  In the past six months, have you accidently leaked urine? N  Do you have problems with loss of bowel control? N  Managing your  Medications? N  Managing your Finances? N  Housekeeping or managing your Housekeeping? N    Patient Care Team: Nelwyn Salisbury, MD as PCP - General  Indicate any recent Medical Services you may have received from other than [facility] providers in the past year (date may be approximate).     Assessment:   This is a routine wellness examination for Stacey Melton.  Hearing/Vision screen Hearing Screening - Comments:: No hearing  difficulty Vision Screening - Comments:: Wears glasses.   Dietary issues and exercise activities discussed: Exercise limited by: None identified   Goals Addressed               This Visit's Progress  Lose weight (pt-stated)         Depression Screen    03/29/2022   12:37 PM 10/11/2021    1:31 PM 12/13/2020    1:45 PM 10/19/2020    2:22 PM 08/17/2020    2:39 PM 05/03/2016   10:31 AM  PHQ 2/9 Scores  PHQ - 2 Score 0 0 0 0 3 0  PHQ- 9 Score  0   14     Fall Risk    03/29/2022   12:39 PM 10/11/2021    1:30 PM 12/13/2020    1:45 PM 10/19/2020    2:22 PM 08/17/2020    2:31 PM  Fall Risk   Falls in the past year? 0 0 0 0 0  Number falls in past yr: 0 0 0 0 0  Injury with Fall? 0 0 0 0 0  Risk for fall due to : No Fall Risks No Fall Risks     Follow up   Falls evaluation completed Falls evaluation completed     FALL RISK PREVENTION PERTAINING TO THE HOME:  Any stairs in or around the home? Yes  If so, are there any without handrails? No  Home free of loose throw rugs in walkways, pet beds, electrical cords, etc? Yes  Adequate lighting in your home to reduce risk of falls? Yes   ASSISTIVE DEVICES UTILIZED TO PREVENT FALLS:  Life alert? No  Use of a cane, walker or w/c? No  Grab bars in the bathroom? Yes  Shower chair or bench in shower? Yes  Elevated toilet seat or a handicapped toilet? No   TIMED UP AND GO:  Was the test performed? No . Audio Visit  Cognitive Function:      Immunizations Immunization History  Administered Date(s)  Administered   Influenza Split 08/07/2011, 07/04/2012   Influenza Whole 07/04/2007, 06/16/2008   Influenza, High Dose Seasonal PF 09/28/2015   Influenza,inj,Quad PF,6+ Mos 07/14/2013, 04/23/2018   Influenza-Unspecified 06/26/2014   PFIZER(Purple Top)SARS-COV-2 Vaccination 11/16/2019, 12/11/2019, 08/11/2020   Pneumococcal Conjugate-13 04/23/2018   Pneumococcal Polysaccharide-23 08/04/2007    Pneumococcal vaccine status: Up to date  Covid-19 vaccine status: Completed vaccines    Screening Tests Health Maintenance  Topic Date Due   COVID-19 Vaccine (4 - Pfizer series) 04/14/2022 (Originally 10/06/2020)   DEXA SCAN  10/11/2022 (Originally 12/12/1999)   Zoster Vaccines- Shingrix (1 of 2) 11/01/2022 (Originally 12/11/1984)   INFLUENZA VACCINE  12/02/2022 (Originally 04/03/2022)   TETANUS/TDAP  11/01/2023 (Originally 12/11/1951)   Pneumonia Vaccine 60+ Years old  Completed   HPV VACCINES  Aged Out    Health Maintenance  There are no preventive care reminders to display for this patient.   Colorectal cancer screening: No longer required.   Mammogram status: No longer required due to Age.    Lung Cancer Screening: (Low Dose CT Chest recommended if Age 24-80 years, 30 pack-year currently smoking OR have quit w/in 15years.) does not qualify.    Additional Screening:  Hepatitis C Screening: does not qualify;   Vision Screening: Recommended annual ophthalmology exams for early detection of glaucoma and other disorders of the eye. Is the patient up to date with their annual eye exam?  Yes  Who is the provider or what is the name of the office in which the patient attends annual eye exams? Patient deferred If pt is not established with a provider, would they like to be referred to a provider to establish care? No .   Dental Screening: Recommended annual dental  exams for proper oral hygiene  Community Resource Referral / Chronic Care Management:  CRR required this visit?  No   CCM  required this visit?  No      Plan:     I have personally reviewed and noted the following in the patient's chart:   Medical and social history Use of alcohol, tobacco or illicit drugs  Current medications and supplements including opioid prescriptions.  Functional ability and status Nutritional status Physical activity Advanced directives List of other physicians Hospitalizations, surgeries, and ER visits in previous 12 months Vitals Screenings to include cognitive, depression, and falls Referrals and appointments  In addition, I have reviewed and discussed with patient certain preventive protocols, quality metrics, and best practice recommendations. A written personalized care plan for preventive services as well as general preventive health recommendations were provided to patient.     Tillie Rung, LPN   04/26/2352   Nurse Notes: None

## 2022-03-29 NOTE — Patient Instructions (Addendum)
[name] , Thank you for taking time to come for your Medicare Wellness Visit. I appreciate your ongoing commitment to your health goals. Please review the following plan we discussed and let me know if I can assist you in the future.   These are the goals we discussed:  Goals       Lose weight (pt-stated)        This is a list of the screening recommended for you and due dates:  Health Maintenance  Topic Date Due   COVID-19 Vaccine (4 - Pfizer series) 04/14/2022*   DEXA scan (bone density measurement)  10/11/2022*   Zoster (Shingles) Vaccine (1 of 2) 11/01/2022*   Flu Shot  12/02/2022*   Tetanus Vaccine  11/01/2023*   Pneumonia Vaccine  Completed   HPV Vaccine  Aged Out  *Topic was postponed. The date shown is not the original due date.    Advanced directives: Yes  Conditions/risks identified: None  Next appointment: Follow up in one year for your annual wellness visit     Preventive Care 65 Years and Older, Female Preventive care refers to lifestyle choices and visits with your health care provider that can promote health and wellness. What does preventive care include? A yearly physical exam. This is also called an annual well check. Dental exams once or twice a year. Routine eye exams. Ask your health care provider how often you should have your eyes checked. Personal lifestyle choices, including: Daily care of your teeth and gums. Regular physical activity. Eating a healthy diet. Avoiding tobacco and drug use. Limiting alcohol use. Practicing safe sex. Taking low-dose aspirin every day. Taking vitamin and mineral supplements as recommended by your health care provider. What happens during an annual well check? The services and screenings done by your health care provider during your annual well check will depend on your age, overall health, lifestyle risk factors, and family history of disease. Counseling  Your health care provider may ask you questions about  your: Alcohol use. Tobacco use. Drug use. Emotional well-being. Home and relationship well-being. Sexual activity. Eating habits. History of falls. Memory and ability to understand (cognition). Work and work Astronomer. Reproductive health. Screening  You may have the following tests or measurements: Height, weight, and BMI. Blood pressure. Lipid and cholesterol levels. These may be checked every 5 years, or more frequently if you are over 95 years old. Skin check. Lung cancer screening. You may have this screening every year starting at age 43 if you have a 30-pack-year history of smoking and currently smoke or have quit within the past 15 years. Fecal occult blood test (FOBT) of the stool. You may have this test every year starting at age 59. Flexible sigmoidoscopy or colonoscopy. You may have a sigmoidoscopy every 5 years or a colonoscopy every 10 years starting at age 71. Hepatitis C blood test. Hepatitis B blood test. Sexually transmitted disease (STD) testing. Diabetes screening. This is done by checking your blood sugar (glucose) after you have not eaten for a while (fasting). You may have this done every 1-3 years. Bone density scan. This is done to screen for osteoporosis. You may have this done starting at age 45. Mammogram. This may be done every 1-2 years. Talk to your health care provider about how often you should have regular mammograms. Talk with your health care provider about your test results, treatment options, and if necessary, the need for more tests. Vaccines  Your health care provider may recommend certain vaccines, such  as: Influenza vaccine. This is recommended every year. Tetanus, diphtheria, and acellular pertussis (Tdap, Td) vaccine. You may need a Td booster every 10 years. Zoster vaccine. You may need this after age 15. Pneumococcal 13-valent conjugate (PCV13) vaccine. One dose is recommended after age 35. Pneumococcal polysaccharide (PPSV23) vaccine.  One dose is recommended after age 93. Talk to your health care provider about which screenings and vaccines you need and how often you need them. This information is not intended to replace advice given to you by your health care provider. Make sure you discuss any questions you have with your health care provider. Document Released: 09/16/2015 Document Revised: 05/09/2016 Document Reviewed: 06/21/2015 Elsevier Interactive Patient Education  2017 [place] Prevention in the Home Falls can cause injuries. They can happen to people of all ages. There are many things you can do to make your home safe and to help prevent falls. What can I do on the outside of my home? Regularly fix the edges of walkways and driveways and fix any cracks. Remove anything that might make you trip as you walk through a door, such as a raised step or threshold. Trim any bushes or trees on the path to your home. Use bright outdoor lighting. Clear any walking paths of anything that might make someone trip, such as rocks or tools. Regularly check to see if handrails are loose or broken. Make sure that both sides of any steps have handrails. Any raised decks and porches should have guardrails on the edges. Have any leaves, snow, or ice cleared regularly. Use sand or salt on walking paths during winter. Clean up any spills in your garage right away. This includes oil or grease spills. What can I do in the bathroom? Use night lights. Install grab bars by the toilet and in the tub and shower. Do not use towel bars as grab bars. Use non-skid mats or decals in the tub or shower. If you need to sit down in the shower, use a plastic, non-slip stool. Keep the floor dry. Clean up any water that spills on the floor as soon as it happens. Remove soap buildup in the tub or shower regularly. Attach bath mats securely with double-sided non-slip rug tape. Do not have throw rugs and other things on the floor that can make  you trip. What can I do in the bedroom? Use night lights. Make sure that you have a light by your bed that is easy to reach. Do not use any sheets or blankets that are too big for your bed. They should not hang down onto the floor. Have a firm chair that has side arms. You can use this for support while you get dressed. Do not have throw rugs and other things on the floor that can make you trip. What can I do in the kitchen? Clean up any spills right away. Avoid walking on wet floors. Keep items that you use a lot in easy-to-reach places. If you need to reach something above you, use a strong step stool that has a grab bar. Keep electrical cords out of the way. Do not use floor polish or wax that makes floors slippery. If you must use wax, use non-skid floor wax. Do not have throw rugs and other things on the floor that can make you trip. What can I do with my stairs? Do not leave any items on the stairs. Make sure that there are handrails on both sides of the stairs and use  them. Fix handrails that are broken or loose. Make sure that handrails are as long as the stairways. Check any carpeting to make sure that it is firmly attached to the stairs. Fix any carpet that is loose or worn. Avoid having throw rugs at the top or bottom of the stairs. If you do have throw rugs, attach them to the floor with carpet tape. Make sure that you have a light switch at the top of the stairs and the bottom of the stairs. If you do not have them, ask someone to add them for you. What else can I do to help prevent falls? Wear shoes that: Do not have high heels. Have rubber bottoms. Are comfortable and fit you well. Are closed at the toe. Do not wear sandals. If you use a stepladder: Make sure that it is fully opened. Do not climb a closed stepladder. Make sure that both sides of the stepladder are locked into place. Ask someone to hold it for you, if possible. Clearly mark and make sure that you can  see: Any grab bars or handrails. First and last steps. Where the edge of each step is. Use tools that help you move around (mobility aids) if they are needed. These include: Canes. Walkers. Scooters. Crutches. Turn on the lights when you go into a dark area. Replace any light bulbs as soon as they burn out. Set up your furniture so you have a clear path. Avoid moving your furniture around. If any of your floors are uneven, fix them. If there are any pets around you, be aware of where they are. Review your medicines with your doctor. Some medicines can make you feel dizzy. This can increase your chance of falling. Ask your doctor what other things that you can do to help prevent falls. This information is not intended to replace advice given to you by your health care provider. Make sure you discuss any questions you have with your health care provider. Document Released: 06/16/2009 Document Revised: 01/26/2016 Document Reviewed: 09/24/2014 Elsevier Interactive Patient Education  2017 [employer].

## 2022-10-30 ENCOUNTER — Ambulatory Visit (INDEPENDENT_AMBULATORY_CARE_PROVIDER_SITE_OTHER): Payer: Medicare Other | Admitting: Family Medicine

## 2022-10-30 ENCOUNTER — Encounter: Payer: Self-pay | Admitting: Family Medicine

## 2022-10-30 VITALS — BP 126/64 | HR 65 | Temp 98.2°F | Wt 176.0 lb

## 2022-10-30 DIAGNOSIS — I10 Essential (primary) hypertension: Secondary | ICD-10-CM | POA: Diagnosis not present

## 2022-10-30 DIAGNOSIS — L989 Disorder of the skin and subcutaneous tissue, unspecified: Secondary | ICD-10-CM

## 2022-10-30 DIAGNOSIS — R739 Hyperglycemia, unspecified: Secondary | ICD-10-CM | POA: Diagnosis not present

## 2022-10-30 DIAGNOSIS — F418 Other specified anxiety disorders: Secondary | ICD-10-CM

## 2022-10-30 DIAGNOSIS — R413 Other amnesia: Secondary | ICD-10-CM | POA: Diagnosis not present

## 2022-10-30 DIAGNOSIS — E538 Deficiency of other specified B group vitamins: Secondary | ICD-10-CM

## 2022-10-30 DIAGNOSIS — E559 Vitamin D deficiency, unspecified: Secondary | ICD-10-CM

## 2022-10-30 LAB — HEPATIC FUNCTION PANEL
ALT: 12 U/L (ref 0–35)
AST: 21 U/L (ref 0–37)
Albumin: 4.1 g/dL (ref 3.5–5.2)
Alkaline Phosphatase: 50 U/L (ref 39–117)
Bilirubin, Direct: 0.2 mg/dL (ref 0.0–0.3)
Total Bilirubin: 0.7 mg/dL (ref 0.2–1.2)
Total Protein: 6.9 g/dL (ref 6.0–8.3)

## 2022-10-30 LAB — BASIC METABOLIC PANEL
BUN: 21 mg/dL (ref 6–23)
CO2: 30 mEq/L (ref 19–32)
Calcium: 9.9 mg/dL (ref 8.4–10.5)
Chloride: 104 mEq/L (ref 96–112)
Creatinine, Ser: 0.93 mg/dL (ref 0.40–1.20)
GFR: 55.12 mL/min — ABNORMAL LOW (ref >60.00)
Glucose, Bld: 130 mg/dL — ABNORMAL HIGH (ref 70–99)
Potassium: 4 mEq/L (ref 3.5–5.1)
Sodium: 143 mEq/L (ref 135–145)

## 2022-10-30 LAB — CBC WITH DIFFERENTIAL/PLATELET
Basophils Absolute: 0 10*3/uL (ref 0.0–0.1)
Basophils Relative: 0.3 % (ref 0.0–3.0)
Eosinophils Absolute: 0.1 10*3/uL (ref 0.0–0.7)
Eosinophils Relative: 1.9 % (ref 0.0–5.0)
HCT: 44.1 % (ref 36.0–46.0)
Hemoglobin: 14.8 g/dL (ref 12.0–15.0)
Lymphocytes Relative: 12 % (ref 12.0–46.0)
Lymphs Abs: 0.8 10*3/uL (ref 0.7–4.0)
MCHC: 33.4 g/dL (ref 30.0–36.0)
MCV: 86.5 fl (ref 78.0–100.0)
Monocytes Absolute: 0.5 10*3/uL (ref 0.1–1.0)
Monocytes Relative: 7.4 % (ref 3.0–12.0)
Neutro Abs: 5.4 10*3/uL (ref 1.4–7.7)
Neutrophils Relative %: 78.4 % — ABNORMAL HIGH (ref 43.0–77.0)
Platelets: 241 10*3/uL (ref 150.0–400.0)
RBC: 5.1 Mil/uL (ref 3.87–5.11)
RDW: 13.9 % (ref 11.5–15.5)
WBC: 6.9 10*3/uL (ref 4.0–10.5)

## 2022-10-30 LAB — VITAMIN B12: Vitamin B-12: 317 pg/mL (ref 211–911)

## 2022-10-30 LAB — VITAMIN D 25 HYDROXY (VIT D DEFICIENCY, FRACTURES): VITD: 18.63 ng/mL — ABNORMAL LOW (ref 30.00–100.00)

## 2022-10-30 LAB — TSH: TSH: 0.85 u[IU]/mL (ref 0.35–5.50)

## 2022-10-30 LAB — HEMOGLOBIN A1C: Hgb A1c MFr Bld: 5.3 % (ref 4.6–6.5)

## 2022-10-30 NOTE — Progress Notes (Signed)
   Subjective:    Patient ID: Stacey Melton, female    DOB: 21-Aug-1933, 89 y.o.   MRN: [id number]  HPI Here with her daughter to follow up on memory loss. We saw her one year ago and we started her on Donepezil 5 mg daily. She took this for a few weeks and then stopped it because she felt it was not helping. Since then the family has noticed significant decline in her memory and her thought processing. She still lives alone, but they visit her several days a week. She rarely takes her Amlodipine, so they ask if this can be stopped. She says her appetite is stable, but the family thinks she eats very little. She has lost 6 lbs in the past year. They also noticed a bump appear on her nose a few months ago, and they ask [place] to look at this.    Review of Systems  Constitutional:  Positive for fatigue.  Respiratory: Negative.    Cardiovascular: Negative.   Gastrointestinal: Negative.   Genitourinary: Negative.   Psychiatric/Behavioral:  Negative for agitation, confusion, dysphoric mood and hallucinations. The patient is not nervous/anxious.        Objective:   Physical Exam Constitutional:      Appearance: Normal appearance.  Cardiovascular:     Rate and Rhythm: Normal rate and regular rhythm.     Pulses: Normal pulses.     Heart sounds: Normal heart sounds.  Pulmonary:     Effort: Pulmonary effort is normal.     Breath sounds: Normal breath sounds.  Skin:    Comments: There is a nodular pearly colored lesion on the right side of the nose about 43m in diameter   Neurological:     Mental Status: She is alert and oriented to person, place, and time.  Psychiatric:        Mood and Affect: Mood normal.           Assessment & Plan:  For the fatigue and memory loss, we will check labs today including a B12 level. The lesion on the nose is likely a basal cell cancer, so we will refer her to Dermatology. We agreed for her to return soon for another appt so we can administer a MMSE for  her. We spent a total of ( 35  ) minutes reviewing records and discussing these issues.  SAlysia Penna MD

## 2022-11-07 ENCOUNTER — Encounter: Payer: Self-pay | Admitting: Family Medicine

## 2022-11-07 ENCOUNTER — Ambulatory Visit (INDEPENDENT_AMBULATORY_CARE_PROVIDER_SITE_OTHER): Payer: Medicare Other | Admitting: Family Medicine

## 2022-11-07 VITALS — BP 130/64 | HR 62 | Temp 98.1°F | Wt 177.6 lb

## 2022-11-07 DIAGNOSIS — R413 Other amnesia: Secondary | ICD-10-CM | POA: Diagnosis not present

## 2022-11-07 NOTE — Progress Notes (Signed)
   Subjective:    Patient ID: Quentin Melton, female    DOB: Apr 09, 1933, 89 y.o.   MRN: [id number]  HPI Here with her daughter to follow up on memory loss. We discussed this a week ago, and we agreed to perform a MMSE today. She participated fully with this, and her score was 15 out of a possible 30 points.   Review of Systems  Constitutional: Negative.        Objective:   Physical Exam Constitutional:      Appearance: Normal appearance.  Neurological:     Mental Status: She is alert.           Assessment & Plan:  Memory loss. Her score on the MMSE today confirms that she has significant cognitive impairment. She currently lives alone, and her daughter visits her weekly to check on things. I advised them that she would likely benefit from taking medications like Aricept and Namenda. They will talk about this and decide what they want to do. It seems evident that she will need to move to a more supervised setting sometime in the near future. Follow up as needed. We spent a total of ( 35  ) minutes reviewing records and discussing these issues.  Alysia Penna, MD

## 2023-04-02 ENCOUNTER — Telehealth: Payer: Medicare HMO | Admitting: Family Medicine

## 2023-07-27 IMAGING — DX DG CHEST 2V
2 series · 2 of 2 positions shown · non-contrast
Comparison: None.

CLINICAL DATA: Right upper lobe rales and cough

EXAM:
CHEST - 2 VIEW

[chest pa]
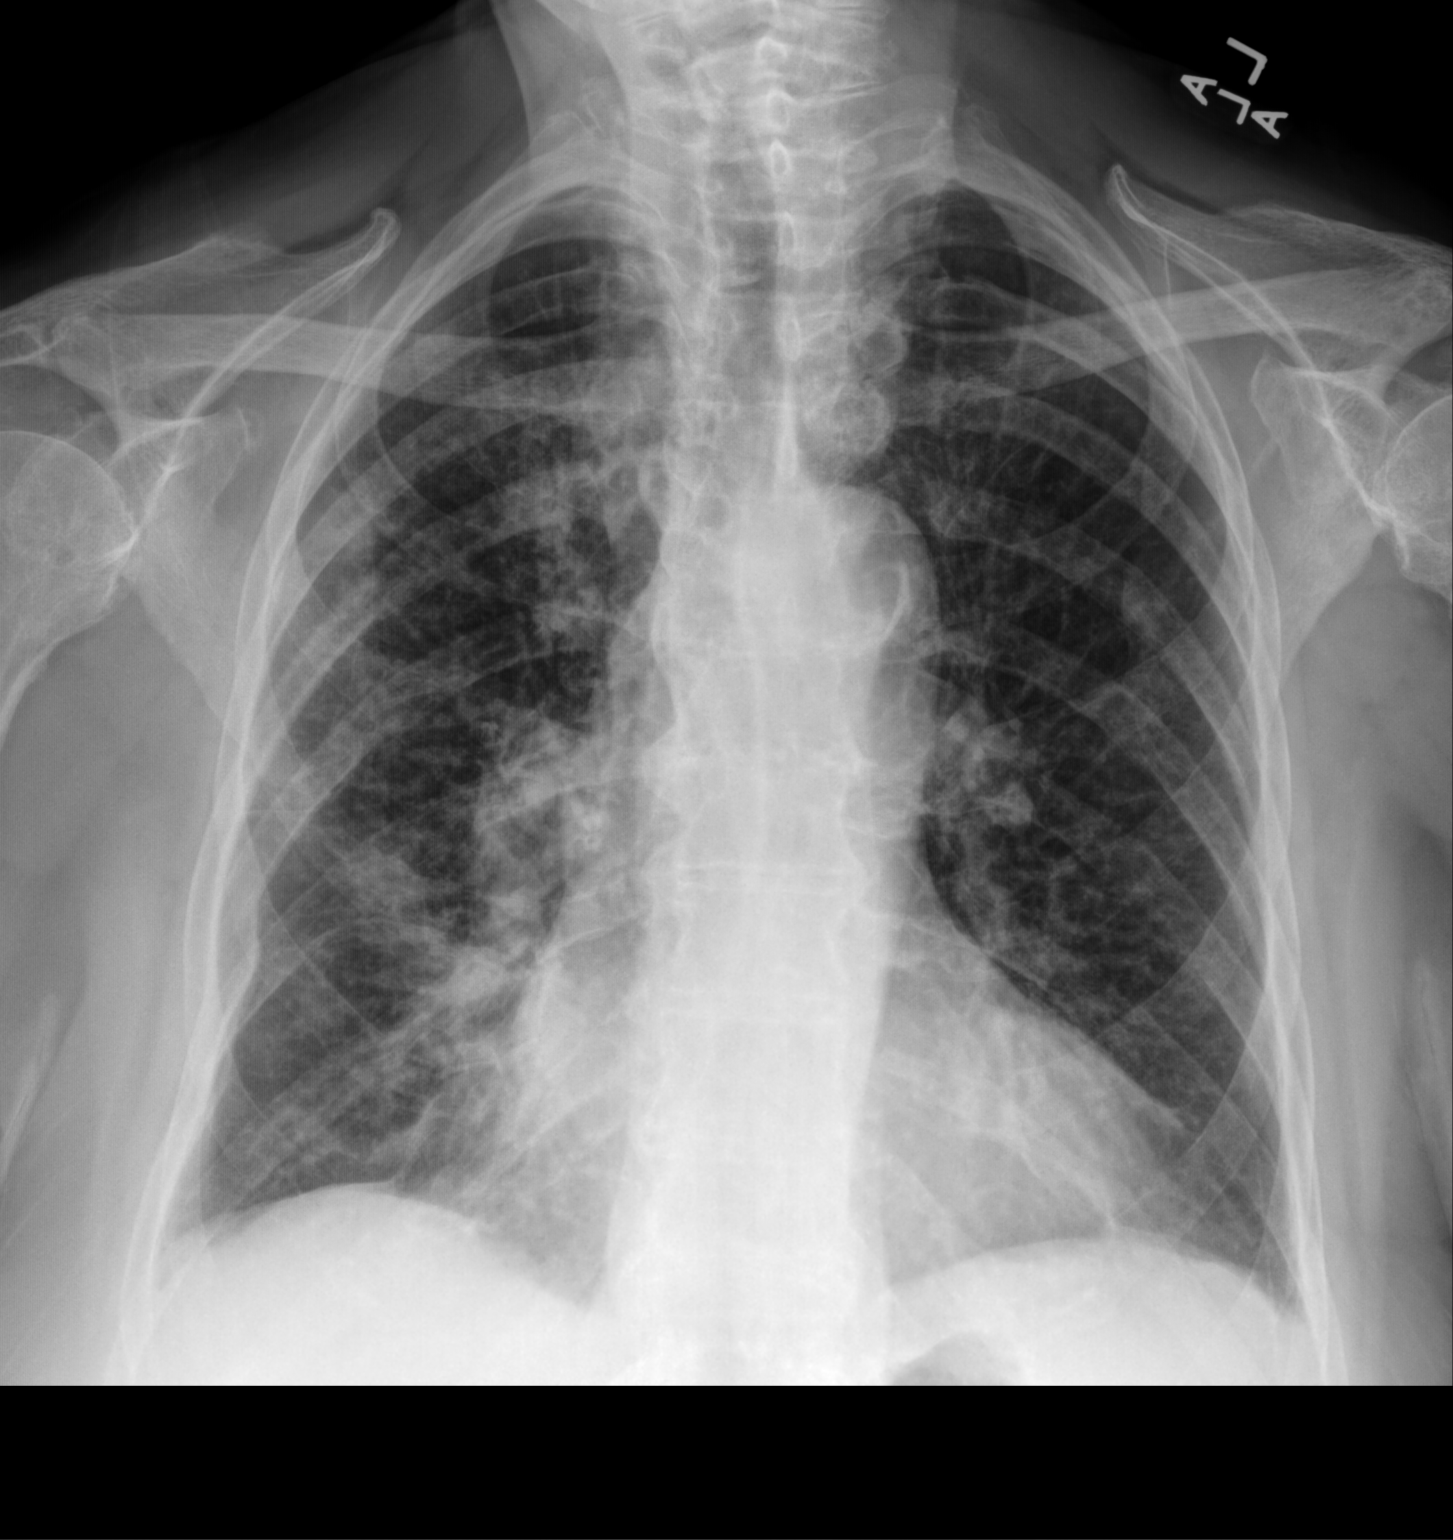

[chest lat]
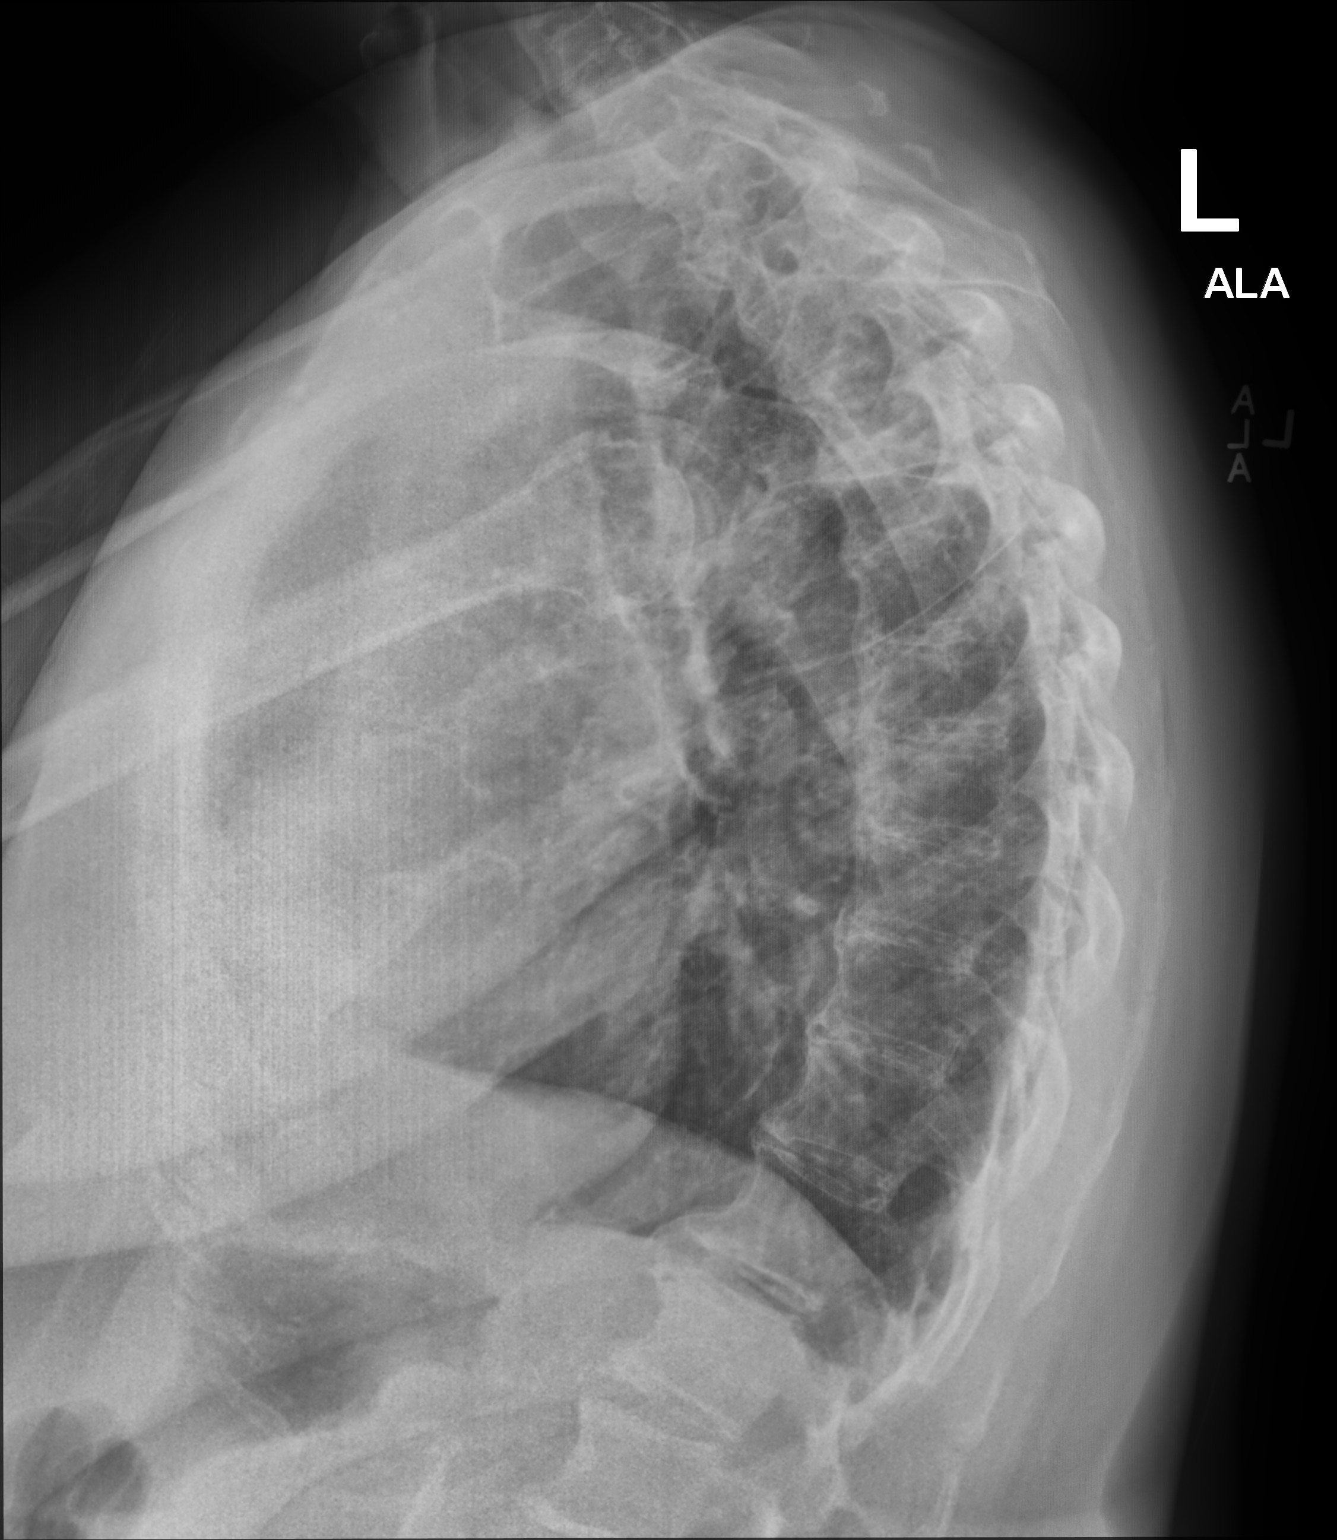

[2 of 2 positions shown; findings below may reference images not displayed]

FINDINGS: Right upper lobe and right lower lobe airspace disease concerning
for multilobar pneumonia. Mild left basilar atelectasis. No pleural
effusion or pneumothorax.

Stable cardiomediastinal silhouette.

No acute osseous abnormality. Ankylosis of the vertebral bodies of
thoracic spine.
IMPRESSION: Right upper lobe and right lower lobe pneumonia.

## 2023-09-18 DIAGNOSIS — H52223 Regular astigmatism, bilateral: Secondary | ICD-10-CM | POA: Diagnosis not present

## 2023-09-18 DIAGNOSIS — H524 Presbyopia: Secondary | ICD-10-CM | POA: Diagnosis not present

## 2023-09-18 DIAGNOSIS — Z9849 Cataract extraction status, unspecified eye: Secondary | ICD-10-CM | POA: Diagnosis not present

## 2023-09-18 DIAGNOSIS — H3122 Choroidal dystrophy (central areolar) (generalized) (peripapillary): Secondary | ICD-10-CM | POA: Diagnosis not present

## 2023-09-18 DIAGNOSIS — Z961 Presence of intraocular lens: Secondary | ICD-10-CM | POA: Diagnosis not present

## 2023-10-09 ENCOUNTER — Ambulatory Visit: Payer: Medicare Other | Admitting: Family Medicine

## 2023-10-09 ENCOUNTER — Ambulatory Visit: Payer: Medicare Other | Admitting: Physician Assistant

## 2023-10-09 ENCOUNTER — Encounter: Payer: Self-pay | Admitting: Physician Assistant

## 2023-10-09 VITALS — BP 130/60 | HR 76 | Temp 98.7°F | Resp 16 | Ht 65.0 in | Wt 164.8 lb

## 2023-10-09 DIAGNOSIS — D229 Melanocytic nevi, unspecified: Secondary | ICD-10-CM

## 2023-10-09 DIAGNOSIS — F418 Other specified anxiety disorders: Secondary | ICD-10-CM

## 2023-10-09 DIAGNOSIS — K219 Gastro-esophageal reflux disease without esophagitis: Secondary | ICD-10-CM

## 2023-10-09 DIAGNOSIS — R413 Other amnesia: Secondary | ICD-10-CM | POA: Diagnosis not present

## 2023-10-09 DIAGNOSIS — I10 Essential (primary) hypertension: Secondary | ICD-10-CM

## 2023-10-09 DIAGNOSIS — J449 Chronic obstructive pulmonary disease, unspecified: Secondary | ICD-10-CM

## 2023-10-09 NOTE — Progress Notes (Signed)
 `  New Patient Office Visit  Subjective    Patient ID: Stacey Melton, female    DOB: 1932-11-14  Age: 89 y.o. MRN: [id number]  CC:  Chief Complaint  Patient presents with   Establish Care   Memory Loss    HPI Stacey Melton presents to establish care. Patient's daughter is with her today. Daughter states they would ike to discuss memory loss and moods.  Discussed the use of AI scribe software for clinical note transcription with the patient, who gave verbal consent to proceed.  History of Present Illness   The patient, an elderly woman with a history of COPD and depression, presents with her daughter due to concerns about her cognitive decline. The patient's daughter reports that her mother's memory issues have been worsening rapidly, causing significant confusion and forgetfulness. The patient acknowledges these issues, stating that she often feels confused and has difficulty remembering important things such as phone calls and meals. She also reports difficulty remembering to do tasks unless she does them immediately. The patient's daughter notes that these memory issues are causing distress for her mother, who often comments on feeling confused.  In addition to her cognitive concerns, the patient has a skin lesion on her nose that has been bleeding and growing. The patient reports that the lesion is not painful, but she has considered having it removed due to its growth. Has a second abnormal mole in the center of her chest that she wishes to have looked at.   The patient also mentions that she lives alone and is a widow, which she believes contributes to her feelings of depression. She reports feeling more depressed in the winter and acknowledges that her living situation can be difficult. Despite her cognitive and emotional challenges, the patient maintains her independence, driving, shopping, and doing housework on her own.       Outpatient Encounter Medications as of  10/09/2023  Medication Sig   [DISCONTINUED] albuterol  (PROAIR  HFA) 108 (90 Base) MCG/ACT inhaler Inhale 2 puffs into the lungs every 6 (six) hours as needed for wheezing or shortness of breath.   No facility-administered encounter medications on file as of 10/09/2023.    Past Medical History:  Diagnosis Date   Allergy    COPD (chronic obstructive pulmonary disease) (HCC)    Cystocele    Depression    GERD (gastroesophageal reflux disease)    Hematuria    microscopic   Hemorrhoids    History of recurrent UTIs    Hypertension    Insomnia    Mild cognitive impairment 2022   Osteoarthritis     Past Surgical History:  Procedure Laterality Date   ABDOMINAL HYSTERECTOMY     COLONOSCOPY  07/07/08   repeat in 3 yrs Dr. Towana   CYSTOSCOPY  12/21/08   normal Dr. Mardy    Family History  Problem Relation Age of Onset   Arthritis Other    Breast cancer Other    Colon cancer Other     Social History   Socioeconomic History   Marital status: Widowed    Spouse name: Not on file   Number of children: Not on file   Years of education: Not on file   Highest education level: 10th grade  Occupational History   Not on file  Tobacco Use   Smoking status: Never   Smokeless tobacco: Never  Substance and Sexual Activity   Alcohol use: Not Currently    Comment: rare   Drug use: No  Sexual activity: Not Currently  Other Topics Concern   Not on file  Social History Narrative   Not on file   Social Drivers of Health   Financial Resource Strain: Low Risk  (10/28/2022)   Overall Financial Resource Strain (CARDIA)    Difficulty of Paying Living Expenses: Not hard at all  Food Insecurity: No Food Insecurity (10/28/2022)   Hunger Vital Sign    Worried About Running Out of Food in the Last Year: Never true    Ran Out of Food in the Last Year: Never true  Transportation Needs: No Transportation Needs (10/28/2022)   PRAPARE - Administrator, Civil Service (Medical): No     Lack of Transportation (Non-Medical): No  Physical Activity: Unknown (10/28/2022)   Exercise Vital Sign    Days of Exercise per Week: 0 days    Minutes of Exercise per Session: Not on file  Recent Concern: Physical Activity - Inactive (10/28/2022)   Exercise Vital Sign    Days of Exercise per Week: 0 days    Minutes of Exercise per Session: 0 min  Stress: Stress Concern Present (10/28/2022)   Stacey Melton [employer] Occupational Health - Occupational Stress Questionnaire    Feeling of Stress : Very much  Social Connections: Socially Isolated (10/28/2022)   Social Connection and Isolation Panel [NHANES]    Frequency of Communication with Friends and Family: More than three times a week    Frequency of Social Gatherings with Friends and Family: Once a week    Attends Religious Services: Never    Database Administrator or Organizations: No    Attends Engineer, Structural: Not on file    Marital Status: Widowed  Intimate Partner Violence: Not At Risk (03/29/2022)   Humiliation, Afraid, Rape, and Kick questionnaire    Fear of Current or Ex-Partner: No    Emotionally Abused: No    Physically Abused: No    Sexually Abused: No    Review of Systems  Constitutional: Negative.   HENT: Negative.    Eyes: Negative.   Respiratory: Negative.    Cardiovascular: Negative.   Gastrointestinal: Negative.   Genitourinary: Negative.   Musculoskeletal:  Positive for back pain.  Skin: Negative.   Neurological: Negative.   Endo/Heme/Allergies: Negative.   Psychiatric/Behavioral:  Positive for depression and memory loss. Negative for suicidal ideas. The patient is not nervous/anxious.         Objective    BP 130/60 (BP Location: Right Arm, Patient Position: Sitting, Cuff Size: Normal)   Pulse 76   Temp 98.7 F (37.1 C) (Temporal)   Resp 16   Ht 5' 5 (1.651 m)   Wt 164 lb 12.8 oz (74.8 kg)   BMI 27.42 kg/m   Physical Exam Constitutional:      Appearance: Normal appearance.  HENT:      Right Ear: Tympanic membrane normal.     Left Ear: Tympanic membrane normal.     Nose: Nose normal.     Mouth/Throat:     Pharynx: No oropharyngeal exudate or posterior oropharyngeal erythema.  Eyes:     Conjunctiva/sclera: Conjunctivae normal.  Neck:     Vascular: No carotid bruit.  Cardiovascular:     Rate and Rhythm: Normal rate and regular rhythm.     Heart sounds: Normal heart sounds.  Pulmonary:     Effort: Pulmonary effort is normal.     Breath sounds: Normal breath sounds.  Abdominal:     General: Bowel sounds are  normal.     Palpations: Abdomen is soft.     Tenderness: There is no abdominal tenderness.  Skin:    Findings: Lesion and rash present. Rash is scaling.          Comments: Abnormal mole with irregular boarders, multiple colors, admits to it changing over time.   Neurological:     Mental Status: She is alert and oriented to person, place, and time.  Psychiatric:        Behavior: Behavior normal.          Media Information   Document Information  Photos    10/09/2023 15:38  Attached To:  Office Visit on 10/09/23 with Milon Cleaves, PA  Source Information  [place], [place], [place]  [facility]  Document History    Assessment & Plan:   Problem List Items Addressed This Visit     Essential hypertension   Controlled Continue with  current treatment Will adjust treatment depending on labs and BP in office BP Readings from Last 3 Encounters:  10/09/23 130/60  11/07/22 130/64  10/30/22 126/64         Relevant Orders   CBC with Differential/Platelet (Completed)   Comprehensive metabolic panel (Completed)   Hemoglobin A1c (Completed)   Lipid panel (Completed)   COPD mixed type (HCC)   No current symptoms or use of inhaler. -Monitor for changes in breathlessness and consider prescription of inhaler if symptoms worsen.      GERD   Controlled Continue to monitor symptoms Continue with current treatment Will adjust treatment  depending on symptoms      Depression with anxiety   History of depression, worsens in winter, currently not on any medication. -Consider restarting Prozac  given high depression scores and patient's history. -Discuss potential benefits of light therapy for seasonal affective disorder.      Memory loss - Primary   Rapid memory loss beyond age-related memory issues, with confusion and forgetfulness of important events. Previous diagnosis of mild cognitive impairment. -Refer to neurology for further evaluation. -Order CT or MRI to differentiate between vascular and atrophic causes. -Order standard blood work including A1c, cholesterol, kidney and liver function tests.      Relevant Orders   Ambulatory referral to Neurology   T4, free (Completed)   TSH (Completed)   VITAMIN D  25 Hydroxy (Vit-D Deficiency, Fractures) (Completed)   B12 and Folate Panel (Completed)   Atypical mole   Raised, dark, non-circular lesion on skin, not sore or bleeding. -Refer to dermatology for evaluation and potential biopsy.      Relevant Orders   Ambulatory referral to Dermatology   Breast Cancer Screening Last mammogram in 2012, no new abnormal breast lumps or drainage. -Consider ordering a mammogram given family history of breast cancer and time since last screening.  General Health Maintenance -Order labs including complete blood count, A1c, cholesterol, thyroid  function, B12, folate, and vitamin D  levels. -Schedule follow-up appointment in three months.      Return in about 3 months (around 01/06/2024) for Chronic, Cleaves.   Cleaves [place], [place]

## 2023-10-10 LAB — LIPID PANEL
Chol/HDL Ratio: 3.7 {ratio} (ref 0.0–4.4)
Cholesterol, Total: 217 mg/dL — ABNORMAL HIGH (ref 100–199)
HDL: 58 mg/dL (ref >39)
LDL Chol Calc (NIH): 148 mg/dL — ABNORMAL HIGH (ref 0–99)
Triglycerides: 63 mg/dL (ref 0–149)
VLDL Cholesterol Cal: 11 mg/dL (ref 5–40)

## 2023-10-10 LAB — COMPREHENSIVE METABOLIC PANEL
ALT: 14 [IU]/L (ref 0–32)
AST: 20 [IU]/L (ref 0–40)
Albumin: 4.4 g/dL (ref 3.7–4.7)
Alkaline Phosphatase: 58 [IU]/L (ref 44–121)
BUN/Creatinine Ratio: 26 (ref 12–28)
BUN: 20 mg/dL (ref 8–27)
Bilirubin Total: 0.6 mg/dL (ref 0.0–1.2)
CO2: 26 mmol/L (ref 20–29)
Calcium: 9.8 mg/dL (ref 8.7–10.3)
Chloride: 106 mmol/L (ref 96–106)
Creatinine, Ser: 0.78 mg/dL (ref 0.57–1.00)
Globulin, Total: 2.3 g/dL (ref 1.5–4.5)
Glucose: 85 mg/dL (ref 70–99)
Potassium: 5.2 mmol/L (ref 3.5–5.2)
Sodium: 147 mmol/L — ABNORMAL HIGH (ref 134–144)
Total Protein: 6.7 g/dL (ref 6.0–8.5)
eGFR: 73 mL/min/{1.73_m2} (ref >59)

## 2023-10-10 LAB — CBC WITH DIFFERENTIAL/PLATELET
Basophils Absolute: 0 10*3/uL (ref 0.0–0.2)
Basos: 1 %
EOS (ABSOLUTE): 0.1 10*3/uL (ref 0.0–0.4)
Eos: 2 %
Hematocrit: 45.2 % (ref 34.0–46.6)
Hemoglobin: 14.7 g/dL (ref 11.1–15.9)
Immature Grans (Abs): 0 10*3/uL (ref 0.0–0.1)
Immature Granulocytes: 1 %
Lymphocytes Absolute: 0.7 10*3/uL (ref 0.7–3.1)
Lymphs: 13 %
MCH: 28.5 pg (ref 26.6–33.0)
MCHC: 32.5 g/dL (ref 31.5–35.7)
MCV: 88 fL (ref 79–97)
Monocytes Absolute: 0.5 10*3/uL (ref 0.1–0.9)
Monocytes: 8 %
Neutrophils Absolute: 4.4 10*3/uL (ref 1.4–7.0)
Neutrophils: 75 %
Platelets: 231 10*3/uL (ref 150–450)
RBC: 5.16 x10E6/uL (ref 3.77–5.28)
RDW: 13.2 % (ref 11.7–15.4)
WBC: 5.8 10*3/uL (ref 3.4–10.8)

## 2023-10-10 LAB — B12 AND FOLATE PANEL
Folate: >20 ng/mL (ref >3.0)
Vitamin B-12: 325 pg/mL (ref 232–1245)

## 2023-10-10 LAB — VITAMIN D 25 HYDROXY (VIT D DEFICIENCY, FRACTURES): Vit D, 25-Hydroxy: 20.6 ng/mL — ABNORMAL LOW (ref 30.0–100.0)

## 2023-10-10 LAB — HEMOGLOBIN A1C
Est. average glucose Bld gHb Est-mCnc: 97 mg/dL
Hgb A1c MFr Bld: 5 % (ref 4.8–5.6)

## 2023-10-10 LAB — T4, FREE: Free T4: 1.07 ng/dL (ref 0.82–1.77)

## 2023-10-10 LAB — TSH: TSH: 0.957 u[IU]/mL (ref 0.450–4.500)

## 2023-10-11 ENCOUNTER — Encounter: Payer: Self-pay | Admitting: Physician Assistant

## 2023-10-13 DIAGNOSIS — D229 Melanocytic nevi, unspecified: Secondary | ICD-10-CM | POA: Insufficient documentation

## 2023-10-13 NOTE — Addendum Note (Signed)
 Addended by: Odilia Bennett on: 10/13/2023 03:29 PM   Modules accepted: Level of Service

## 2023-10-13 NOTE — Assessment & Plan Note (Signed)
 Controlled Continue to monitor symptoms Continue with current treatment Will adjust treatment depending on symptoms

## 2023-10-13 NOTE — Assessment & Plan Note (Signed)
 No current symptoms or use of inhaler. -Monitor for changes in breathlessness and consider prescription of inhaler if symptoms worsen.

## 2023-10-13 NOTE — Assessment & Plan Note (Signed)
 History of depression, worsens in winter, currently not on any medication. -Consider restarting Prozac  given high depression scores and patient's history. -Discuss potential benefits of light therapy for seasonal affective disorder.

## 2023-10-13 NOTE — Assessment & Plan Note (Signed)
 Raised, dark, non-circular lesion on skin, not sore or bleeding. -Refer to dermatology for evaluation and potential biopsy.

## 2023-10-13 NOTE — Assessment & Plan Note (Signed)
 Rapid memory loss beyond age-related memory issues, with confusion and forgetfulness of important events. Previous diagnosis of mild cognitive impairment. -Refer to neurology for further evaluation. -Order CT or MRI to differentiate between vascular and atrophic causes. -Order standard blood work including A1c, cholesterol, kidney and liver function tests.

## 2023-10-13 NOTE — Assessment & Plan Note (Signed)
 Controlled Continue with  current treatment Will adjust treatment depending on labs and BP in office BP Readings from Last 3 Encounters:  10/09/23 130/60  11/07/22 130/64  10/30/22 126/64

## 2023-10-14 ENCOUNTER — Encounter: Payer: Self-pay | Admitting: Physician Assistant

## 2023-11-07 ENCOUNTER — Encounter: Payer: Self-pay | Admitting: Dermatology

## 2023-11-07 ENCOUNTER — Ambulatory Visit: Payer: Medicare Other | Admitting: Dermatology

## 2023-11-07 VITALS — BP 120/71 | HR 60

## 2023-11-07 DIAGNOSIS — D492 Neoplasm of unspecified behavior of bone, soft tissue, and skin: Secondary | ICD-10-CM | POA: Diagnosis not present

## 2023-11-07 DIAGNOSIS — C439 Malignant melanoma of skin, unspecified: Secondary | ICD-10-CM

## 2023-11-07 DIAGNOSIS — C4359 Malignant melanoma of other part of trunk: Secondary | ICD-10-CM | POA: Diagnosis not present

## 2023-11-07 DIAGNOSIS — D485 Neoplasm of uncertain behavior of skin: Secondary | ICD-10-CM

## 2023-11-07 HISTORY — DX: Malignant melanoma of skin, unspecified: C43.9

## 2023-11-07 NOTE — Patient Instructions (Addendum)

## 2023-11-07 NOTE — Progress Notes (Signed)
   New Patient Visit   Subjective  Stacey Melton is a 89 y.o. female who presents for the following: growth on chest Pt has growth on the chest that has been present for a long time. Her pcp wanted to have it evaluated. Pt has no hx of skin cancer. Patient is accompanied by her daughter.  The following portions of the chart were reviewed this encounter and updated as appropriate: medications, allergies, medical history  Review of Systems:  No other skin or systemic complaints except as noted in HPI or Assessment and Plan.  Objective  Well appearing patient in no apparent distress; mood and affect are within normal limits.  A focused examination was performed of the following areas: chest  Relevant exam findings are noted in the Assessment and Plan.  mid chest Irregular brown macule with darker spot inside   Assessment & Plan   NEOPLASM OF UNCERTAIN BEHAVIOR OF SKIN mid chest Skin / nail biopsy Type of biopsy: tangential   Informed consent: discussed and consent obtained   Timeout: patient name, date of birth, surgical site, and procedure verified   Procedure prep:  Patient was prepped and draped in usual sterile fashion Prep type:  Isopropyl alcohol Anesthesia: the lesion was anesthetized in a standard fashion   Instrument used: DermaBlade   Hemostasis achieved with: pressure and aluminum chloride   Outcome: patient tolerated procedure well   Post-procedure details: sterile dressing applied and wound care instructions given   Dressing type: petrolatum gauze and bandage   Specimen 1 - Surgical pathology Differential Diagnosis: R/O ISK vs MM  Check Margins: No  Return for based on biopsy results.  I, Tillie Fantasia, CMA, am acting as scribe for Gwenith Daily, MD.   Documentation: I have reviewed the above documentation for accuracy and completeness, and I agree with the above.  Gwenith Daily, MD

## 2023-11-11 LAB — SURGICAL PATHOLOGY

## 2023-11-19 ENCOUNTER — Telehealth: Payer: Self-pay

## 2023-11-19 NOTE — Telephone Encounter (Signed)
 Please advise  Copied from CRM [id number]. Topic: Clinical - Medication Question >> Nov 19, 2023  1:06 PM Geneva B wrote: Reason for CRM: patients daughter patrica is calling saying that the patient seen the provider for depression and they talked about getting the patient on a depression rx and now they are wanting to get on that rx please call the daughter back [phone]

## 2023-11-21 ENCOUNTER — Encounter: Payer: Self-pay | Admitting: Physician Assistant

## 2023-11-21 ENCOUNTER — Other Ambulatory Visit: Payer: Self-pay | Admitting: Physician Assistant

## 2023-11-21 DIAGNOSIS — F418 Other specified anxiety disorders: Secondary | ICD-10-CM

## 2023-11-21 MED ORDER — FLUOXETINE HCL 20 MG PO TABS: 20.0000 mg | ORAL_TABLET | Freq: Every day | ORAL | 3 refills | Status: DC

## 2023-11-22 DIAGNOSIS — H353133 Nonexudative age-related macular degeneration, bilateral, advanced atrophic without subfoveal involvement: Secondary | ICD-10-CM | POA: Diagnosis not present

## 2023-11-22 DIAGNOSIS — H43813 Vitreous degeneration, bilateral: Secondary | ICD-10-CM | POA: Diagnosis not present

## 2023-11-22 DIAGNOSIS — D3132 Benign neoplasm of left choroid: Secondary | ICD-10-CM | POA: Diagnosis not present

## 2023-11-22 DIAGNOSIS — H3530 Unspecified macular degeneration: Secondary | ICD-10-CM | POA: Insufficient documentation

## 2023-11-27 ENCOUNTER — Encounter: Payer: Self-pay | Admitting: Physician Assistant

## 2023-12-02 ENCOUNTER — Encounter: Payer: Self-pay | Admitting: Physician Assistant

## 2023-12-02 ENCOUNTER — Encounter: Payer: Self-pay | Admitting: Dermatology

## 2023-12-04 ENCOUNTER — Ambulatory Visit: Payer: Medicare Other | Admitting: Physician Assistant

## 2023-12-04 ENCOUNTER — Encounter: Payer: Self-pay | Admitting: Physician Assistant

## 2023-12-04 ENCOUNTER — Ambulatory Visit: Payer: Medicare Other

## 2023-12-04 VITALS — BP 130/70 | HR 58 | Resp 18 | Ht 65.0 in | Wt 164.0 lb

## 2023-12-04 DIAGNOSIS — R413 Other amnesia: Secondary | ICD-10-CM | POA: Diagnosis not present

## 2023-12-04 MED ORDER — DONEPEZIL HCL 10 MG PO TABS: ORAL_TABLET | ORAL | 3 refills | Status: DC

## 2023-12-04 NOTE — Progress Notes (Signed)
 Assessment/Plan:     Stacey Melton is a very pleasant 89 y.o. year old RH female with a history of hypertension, hyperlipidemia, arthritis, anxiety, depression, COPD seen today for evaluation of memory loss. MoCA today is  /30***.  Workup is in progress, but findings are suspicious for dementia*** Patient needs assistance with some ADLs.  Patient no longer drives.  Dementia ***  MRI brain without contrast to assess for underlying structural abnormality and assess vascular load  Replenish B12 (325), Vit D (20) Start donepezil 10 mg daily, side effects discussed   Recommend good control of cardiovascular risk factors.   Continue to control mood as per PCP, she is on prozac [place] up in    Subjective:    The patient is accompanied by her daughter  who supplements the history.    How long did patient have memory difficulties?  For the last 8 years, for the last 1 year .  Patient has difficulty remembering new information, recent conversations and names of people, phone calls, meals. Used to do brain stimulating exercises, but not recently repeats oneself?  Endorsed Disoriented when walking into a room?  Denies except occasionally not remembering what patient came to the room for   Leaving objects in unusual places?   Denies.  Wandering behavior? Denies.   Any personality changes, or depression, anxiety?  He has a history of anxiety and depression since the death of her husband  Hallucinations or paranoia?  Denies.   Seizures? Denies.    Any sleep changes?  Sleeps well, in the past had nightmares but not recently.  Denies REM behavior or sleepwalking.   Sleep apnea? Denies.   Any hygiene concerns?  Denies.   Independent of bathing and dressing?  Endorsed  Who is in charge of the medications? Patient is in charge, daughter fills the pillbox, she may forget to take them.    Who is in charge of the finances? Daughter  is in charge     Any changes in appetite?   Not eating healthy  foods. Does not drink plenty water    Patient have trouble swallowing?  Denies.   Does the patient cook? Yes, denies any issues     Any history of headaches?  Denies.   Chronic back pain?  Denies at this time. In the past she  had some arthitic pain  Ambulates with difficulty? Yes, walks without sany de=vices, just around the house or the yard***   Recent falls or head injuries? As a child she fell on the pavemement with LOC     Vision changes? Denies. Has advaced macular degeneration L eye decreased vision Stroke like symptoms?  Denies.   Any tremors?  Denies.   Any anosmia?  Over the last 6 months  Any incontinence of urine? Denies.   Any bowel dysfunction? Denies.      Patient lives alone ***  History of heavy alcohol intake? Denies.   History of heavy tobacco use? Denies.   Family history of dementia?   Grandmother had dementia, possible AD Does patient drive? yes, short distances.  denies any issues, burt her ophthalmologist recomm no further driving   No Known Allergies  Current Outpatient Medications  Medication Instructions   cholecalciferol (VITAMIN D3) 1,000 Units, Daily   FLUoxetine (PROZAC) 20 mg, Oral, Daily     VITALS:   Vitals:   12/04/23 1302  BP: (!) 165/69  Pulse: (!) 58  Resp: 18  SpO2: 95%  Weight: 164 lb (74.4  kg)  Height: 5\' 5"  (1.651 m)      PHYSICAL EXAM   HEENT:  Normocephalic, atraumatic. The mucous membranes are moist. The superficial temporal arteries are without ropiness or tenderness. Cardiovascular: Regular rate and rhythm. Lungs: Clear to auscultation bilaterally. Neck: There are no carotid bruits noted bilaterally.  NEUROLOGICAL:     No data to display              No data to display           Orientation:  Alert and oriented to person, not to place and time***. No aphasia or dysarthria. Fund of knowledge is reduced. Recent and remote memory impaired.  Attention and concentration are reduced.  Able to name objects and  unable to repeat phrases. Delayed recall    Cranial nerves: There is good facial symmetry. Extraocular muscles are intact and visual fields are full to confrontational testing. Speech is fluent and clear, no tongue deviation. Hearing is intact to conversational tone.*** Tone: Tone is good throughout. Sensation: Sensation is intact to light touch and pinprick throughout. Vibration is intact at the bilateral big toe. Coordination: The patient has no difficulty with RAM's or FNF bilaterally. Normal finger to nose  Motor: Strength is 5/5 in the bilateral upper and lower extremities. There is no pronator drift. There are no fasciculations noted. DTR's: Deep tendon reflexes are 2/4 .  Plantar responses are downgoing bilaterally. Gait and Station: The patient is able to ambulate with difficulty. Needs a walker to ambulate ***. Gait is cautious and narrow.      Thank you for allowing [place] the opportunity to participate in the care of this nice patient. Please do not hesitate to contact us for any questions or concerns.   Total time spent on today's visit was *** minutes dedicated to this patient today, preparing to see patient, examining the patient, ordering tests and/or medications and counseling the patient, documenting clinical information in the EHR or other health record, independently interpreting results and communicating results to the patient/family, discussing treatment and goals, answering patient's questions and coordinating care.  Cc:  Langley Gauss, PA  Marlowe Kays 12/04/2023 2:12 PM

## 2023-12-04 NOTE — Patient Instructions (Addendum)
 It was a pleasure to see you today at our office.   Recommendations:   MRI of the brain, the radiology office will call you to arrange you appointment   Replenish B12  1000 micrograms daily , Vit D   Start donepezil 10 mg daily,  take half pill for 2 weeks and increase to 1 tablet daily   Follow up  June 3 at 17 M  Recommend visiting the website : " Dementia Success Path" to better understand some behaviors related to memory loss.  For psychiatric meds, mood meds: Please have your primary care physician manage these medications.  If you have any severe symptoms of a stroke, or other severe issues such as confusion,severe chills or fever, etc call 911 or go to the ER as you may need to be evaluated further  Consider [facility]  [address], [place] [address] [phone]  Hours of Operation Mondays to Thursdays: 8 am to 8 pm,Fridays: 9 am to 8 pm, Saturdays: 9 am to 1 pm Sundays: Closed  [email or web address]    For assessment of decision of mental capacity and competency:  Call Dr. Erick Blinks, geriatric psychiatrist at [phone]   Whom to call: Memory  decline, memory medications: Call our office [phone]    [email or web address]   RECOMMENDATIONS FOR ALL PATIENTS WITH MEMORY PROBLEMS: 1. Continue to exercise (Recommend 30 minutes of walking everyday, or 3 hours every week) 2. Increase social interactions - continue going to [place] and enjoy social gatherings with friends and family 3. Eat healthy, avoid fried foods and eat more fruits and vegetables 4. Maintain adequate blood pressure, blood sugar, and blood cholesterol level. Reducing the risk of stroke and cardiovascular disease also helps promoting better memory. 5. Avoid stressful situations. Live a simple life and avoid aggravations. Organize your time and  prepare for the next day in anticipation. 6. Sleep well, avoid any interruptions of sleep and avoid any distractions in the bedroom that may interfere with adequate sleep quality 7. Avoid sugar, avoid sweets as there is a strong link between excessive sugar intake, diabetes, and cognitive impairment We discussed the Mediterranean diet, which has been shown to help patients reduce the risk of progressive memory disorders and reduces cardiovascular risk. This includes eating fish, eat fruits and green leafy vegetables, nuts like almonds and hazelnuts, walnuts, and also use olive oil. Avoid fast foods and fried foods as much as possible. Avoid sweets and sugar as sugar use has been linked to worsening of memory function.  There is always a concern of gradual progression of memory problems. If this is the case, then we may need to adjust level of care according to patient needs. Support, both to the patient and caregiver, should then be put into place.       FALL PRECAUTIONS: Be cautious when walking. Scan the area for obstacles that may increase the risk of trips and falls. When getting up in the mornings, sit up at the edge of the bed for a few minutes before getting out of bed. Consider elevating the bed at the head end to avoid drop of blood pressure when getting up. Walk always in a well-lit room (use night lights in the walls). Avoid area rugs or power cords from appliances in the middle of the walkways. Use a walker or a cane if necessary and consider physical therapy for balance exercise. Get your eyesight checked regularly.  FINANCIAL OVERSIGHT: Supervision, especially oversight when making financial decisions or transactions is  also recommended.  HOME SAFETY: Consider the safety of the kitchen when operating appliances like stoves, microwave oven, and blender. Consider having supervision and share cooking responsibilities until no longer able to participate in those. Accidents with firearms and  other hazards in the house should be identified and addressed as well.   ABILITY TO BE LEFT ALONE: If patient is unable to contact 911 operator, consider using LifeLine, or when the need is there, arrange for someone to stay with patients. Smoking is a fire hazard, consider supervision or cessation. Risk of wandering should be assessed by caregiver and if detected at any point, supervision and safe proof recommendations should be instituted.  MEDICATION SUPERVISION: Inability to self-administer medication needs to be constantly addressed. Implement a mechanism to ensure safe administration of the medications.      Mediterranean Diet A Mediterranean diet refers to food and lifestyle choices that are based on the traditions of countries located on the [employer]. This way of eating has been shown to help prevent certain conditions and improve outcomes for people who have chronic diseases, like kidney disease and heart disease. What are tips for following this plan? Lifestyle  Cook and eat meals together with your family, when possible. Drink enough fluid to keep your urine clear or pale yellow. Be physically active every day. This includes: Aerobic exercise like running or swimming. Leisure activities like gardening, walking, or housework. Get 7-8 hours of sleep each night. If recommended by your health care provider, drink red wine in moderation. This means 1 glass a day for nonpregnant women and 2 glasses a day for men. A glass of wine equals 5 oz (150 mL). Reading food labels  Check the serving size of packaged foods. For foods such as rice and pasta, the serving size refers to the amount of cooked product, not dry. Check the total fat in packaged foods. Avoid foods that have saturated fat or trans fats. Check the ingredients list for added sugars, such as corn syrup. Shopping  At the grocery store, buy most of your food from the areas near the walls of the store. This  includes: Fresh fruits and vegetables (produce). Grains, beans, nuts, and seeds. Some of these may be available in unpackaged forms or large amounts (in bulk). Fresh seafood. Poultry and eggs. Low-fat dairy products. Buy whole ingredients instead of prepackaged foods. Buy fresh fruits and vegetables in-season from local farmers markets. Buy frozen fruits and vegetables in resealable bags. If you do not have access to quality fresh seafood, buy precooked frozen shrimp or canned fish, such as tuna, salmon, or sardines. Buy small amounts of raw or cooked vegetables, salads, or olives from the deli or salad bar at your store. Stock your pantry so you always have certain foods on hand, such as olive oil, canned tuna, canned tomatoes, rice, pasta, and beans. Cooking  Cook foods with extra-virgin olive oil instead of using butter or other vegetable oils. Have meat as a side dish, and have vegetables or grains as your main dish. This means having meat in small portions or adding small amounts of meat to foods like pasta or stew. Use beans or vegetables instead of meat in common dishes like chili or lasagna. Experiment with different cooking methods. Try roasting or broiling vegetables instead of steaming or sauteing them. Add frozen vegetables to soups, stews, pasta, or rice. Add nuts or seeds for added healthy fat at each meal. You can add these to yogurt, salads, or vegetable dishes. Marinate  fish or vegetables using olive oil, lemon juice, garlic, and fresh herbs. Meal planning  Plan to eat 1 vegetarian meal one day each week. Try to work up to 2 vegetarian meals, if possible. Eat seafood 2 or more times a week. Have healthy snacks readily available, such as: Vegetable sticks with hummus. Greek yogurt. Fruit and nut trail mix. Eat balanced meals throughout the week. This includes: Fruit: 2-3 servings a day Vegetables: 4-5 servings a day Low-fat dairy: 2 servings a day Fish, poultry, or  lean meat: 1 serving a day Beans and legumes: 2 or more servings a week Nuts and seeds: 1-2 servings a day Whole grains: 6-8 servings a day Extra-virgin olive oil: 3-4 servings a day Limit red meat and sweets to only a few servings a month What are my food choices? Mediterranean diet Recommended Grains: Whole-grain pasta. Brown rice. Bulgar wheat. Polenta. Couscous. Whole-wheat bread. Orpah Cobb. Vegetables: Artichokes. Beets. Broccoli. Cabbage. Carrots. Eggplant. Green beans. Chard. Kale. Spinach. Onions. Leeks. Peas. Squash. Tomatoes. Peppers. Radishes. Fruits: Apples. Apricots. Avocado. Berries. Bananas. Cherries. Dates. Figs. Grapes. Lemons. Melon. Oranges. Peaches. Plums. Pomegranate. Meats and other protein foods: Beans. Almonds. Sunflower seeds. Pine nuts. Peanuts. Cod. Salmon. Scallops. Shrimp. Tuna. Tilapia. Clams. Oysters. Eggs. Dairy: Low-fat milk. Cheese. Greek yogurt. Beverages: Water. Red wine. Herbal tea. Fats and oils: Extra virgin olive oil. Avocado oil. Grape seed oil. Sweets and desserts: [place] yogurt with honey. Baked apples. Poached pears. Trail mix. Seasoning and other foods: Basil. Cilantro. Coriander. Cumin. Mint. Parsley. Sage. Rosemary. Tarragon. Garlic. Oregano. Thyme. Pepper. Balsalmic vinegar. Tahini. Hummus. Tomato sauce. Olives. Mushrooms. Limit these Grains: Prepackaged pasta or rice dishes. Prepackaged cereal with added sugar. Vegetables: Deep fried potatoes (french fries). Fruits: Fruit canned in syrup. Meats and other protein foods: Beef. Pork. Lamb. Poultry with skin. Hot dogs. Tomasa Blase. Dairy: Ice cream. Sour cream. Whole milk. Beverages: Juice. Sugar-sweetened soft drinks. Beer. Liquor and spirits. Fats and oils: Butter. Canola oil. Vegetable oil. Beef fat (tallow). Lard. Sweets and desserts: Cookies. Cakes. Pies. Candy. Seasoning and other foods: Mayonnaise. Premade sauces and marinades. The items listed may not be a complete list. Talk with your  dietitian about what dietary choices are right for you. Summary The Mediterranean diet includes both food and lifestyle choices. Eat a variety of fresh fruits and vegetables, beans, nuts, seeds, and whole grains. Limit the amount of red meat and sweets that you eat. Talk with your health care provider about whether it is safe for you to drink red wine in moderation. This means 1 glass a day for nonpregnant women and 2 glasses a day for men. A glass of wine equals 5 oz (150 mL). This information is not intended to replace advice given to you by your health care provider. Make sure you discuss any questions you have with your health care provider. Document Released: 04/12/2016 Document Revised: 05/15/2016 Document Reviewed: 04/12/2016 Elsevier Interactive Patient Education  2017 [employer].

## 2023-12-05 MED ORDER — MEMANTINE HCL 10 MG PO TABS: ORAL_TABLET | ORAL | 11 refills | Status: AC

## 2023-12-06 ENCOUNTER — Emergency Department (HOSPITAL_COMMUNITY)

## 2023-12-06 ENCOUNTER — Other Ambulatory Visit: Payer: Self-pay

## 2023-12-06 ENCOUNTER — Emergency Department (HOSPITAL_COMMUNITY)
Admission: EM | Admit: 2023-12-06 | Discharge: 2023-12-07 | Disposition: A | Discharge status: Home / Self Care | Attending: Emergency Medicine | Admitting: Emergency Medicine

## 2023-12-06 ENCOUNTER — Encounter (HOSPITAL_COMMUNITY): Payer: Self-pay

## 2023-12-06 DIAGNOSIS — R0602 Shortness of breath: Secondary | ICD-10-CM | POA: Insufficient documentation

## 2023-12-06 DIAGNOSIS — N3000 Acute cystitis without hematuria: Secondary | ICD-10-CM | POA: Diagnosis not present

## 2023-12-06 DIAGNOSIS — R42 Dizziness and giddiness: Secondary | ICD-10-CM | POA: Diagnosis not present

## 2023-12-06 DIAGNOSIS — R531 Weakness: Secondary | ICD-10-CM | POA: Diagnosis not present

## 2023-12-06 DIAGNOSIS — R6889 Other general symptoms and signs: Secondary | ICD-10-CM | POA: Diagnosis not present

## 2023-12-06 DIAGNOSIS — R079 Chest pain, unspecified: Secondary | ICD-10-CM | POA: Insufficient documentation

## 2023-12-06 DIAGNOSIS — I499 Cardiac arrhythmia, unspecified: Secondary | ICD-10-CM | POA: Diagnosis not present

## 2023-12-06 DIAGNOSIS — I6523 Occlusion and stenosis of bilateral carotid arteries: Secondary | ICD-10-CM | POA: Diagnosis not present

## 2023-12-06 DIAGNOSIS — I10 Essential (primary) hypertension: Secondary | ICD-10-CM | POA: Insufficient documentation

## 2023-12-06 DIAGNOSIS — I700 Atherosclerosis of aorta: Secondary | ICD-10-CM | POA: Diagnosis not present

## 2023-12-06 DIAGNOSIS — Z743 Need for continuous supervision: Secondary | ICD-10-CM | POA: Diagnosis not present

## 2023-12-06 DIAGNOSIS — R059 Cough, unspecified: Secondary | ICD-10-CM | POA: Diagnosis not present

## 2023-12-06 DIAGNOSIS — R110 Nausea: Secondary | ICD-10-CM | POA: Insufficient documentation

## 2023-12-06 DIAGNOSIS — I517 Cardiomegaly: Secondary | ICD-10-CM | POA: Diagnosis not present

## 2023-12-06 DIAGNOSIS — J449 Chronic obstructive pulmonary disease, unspecified: Secondary | ICD-10-CM | POA: Insufficient documentation

## 2023-12-06 LAB — URINALYSIS, ROUTINE W REFLEX MICROSCOPIC
Bilirubin Urine: NEGATIVE
Glucose, UA: NEGATIVE mg/dL
Ketones, ur: 20 mg/dL — AB
Nitrite: POSITIVE — AB
Protein, ur: NEGATIVE mg/dL
Specific Gravity, Urine: 1.021 (ref 1.005–1.030)
pH: 5 (ref 5.0–8.0)

## 2023-12-06 LAB — CBC WITH DIFFERENTIAL/PLATELET
Abs Immature Granulocytes: 0.04 10*3/uL (ref 0.00–0.07)
Basophils Absolute: 0 10*3/uL (ref 0.0–0.1)
Basophils Relative: 0 %
Eosinophils Absolute: 0 10*3/uL (ref 0.0–0.5)
Eosinophils Relative: 0 %
HCT: 44.8 % (ref 36.0–46.0)
Hemoglobin: 14.3 g/dL (ref 12.0–15.0)
Immature Granulocytes: 1 %
Lymphocytes Relative: 5 %
Lymphs Abs: 0.4 10*3/uL — ABNORMAL LOW (ref 0.7–4.0)
MCH: 28.7 pg (ref 26.0–34.0)
MCHC: 31.9 g/dL (ref 30.0–36.0)
MCV: 90 fL (ref 80.0–100.0)
Monocytes Absolute: 0.4 10*3/uL (ref 0.1–1.0)
Monocytes Relative: 5 %
Neutro Abs: 6.9 10*3/uL (ref 1.7–7.7)
Neutrophils Relative %: 89 %
Platelets: 199 10*3/uL (ref 150–400)
RBC: 4.98 MIL/uL (ref 3.87–5.11)
RDW: 13.3 % (ref 11.5–15.5)
WBC: 7.7 10*3/uL (ref 4.0–10.5)
nRBC: 0 % (ref 0.0–0.2)

## 2023-12-06 LAB — COMPREHENSIVE METABOLIC PANEL WITH GFR
ALT: 18 U/L (ref 0–44)
AST: 18 U/L (ref 15–41)
Albumin: 4 g/dL (ref 3.5–5.0)
Alkaline Phosphatase: 37 U/L — ABNORMAL LOW (ref 38–126)
Anion gap: 8 (ref 5–15)
BUN: 17 mg/dL (ref 8–23)
CO2: 27 mmol/L (ref 22–32)
Calcium: 9.4 mg/dL (ref 8.9–10.3)
Chloride: 105 mmol/L (ref 98–111)
Creatinine, Ser: 0.91 mg/dL (ref 0.44–1.00)
GFR, Estimated: >60 mL/min (ref >60)
Glucose, Bld: 134 mg/dL — ABNORMAL HIGH (ref 70–99)
Potassium: 4.6 mmol/L (ref 3.5–5.1)
Sodium: 140 mmol/L (ref 135–145)
Total Bilirubin: 0.8 mg/dL (ref 0.0–1.2)
Total Protein: 6.6 g/dL (ref 6.5–8.1)

## 2023-12-06 LAB — TROPONIN I (HIGH SENSITIVITY)
Troponin I (High Sensitivity): 9 ng/L (ref <18)
Troponin I (High Sensitivity): 10 ng/L (ref <18)

## 2023-12-06 LAB — BRAIN NATRIURETIC PEPTIDE: B Natriuretic Peptide: 400.4 pg/mL — ABNORMAL HIGH (ref 0.0–100.0)

## 2023-12-06 MED ORDER — SODIUM CHLORIDE 0.9 % IV SOLN
1.0000 g | Freq: Once | INTRAVENOUS | Status: AC
  Administered 2023-12-06: 1 g via INTRAVENOUS
  Filled 2023-12-06: qty 10

## 2023-12-06 MED ORDER — CEFDINIR 300 MG PO CAPS: 300.0000 mg | ORAL_CAPSULE | Freq: Two times a day (BID) | ORAL | 0 refills | Status: AC

## 2023-12-06 NOTE — ED Provider Notes (Signed)
 [place] EMERGENCY DEPARTMENT AT [facility] Provider Note  CSN: [id number] Arrival date & time: 12/06/23 1824  Chief Complaint(s) Chest Pain  HPI Stacey Melton is a 89 y.o. female history of COPD, cognitive impairment presented to the emergency department with shortness of breath.  Patient apparently went to [employer], after eating, developed nausea, generalized weakness, shortness of breath, chest pain, lightheadedness and dizziness.  Son took her to a fire station, they took patient to the hospital.  Patient reports she still feels very weak.  They gave patient Zofran, aspirin.  No similar episode previously.  Otherwise has been at baseline state of health.  No fevers or chills, headache, head injury, abdominal pain, diarrhea, leg swelling, any other new symptoms.   Past Medical History Past Medical History:  Diagnosis Date   Allergy    COPD (chronic obstructive pulmonary disease) (HCC)    Cystocele    Depression    GERD (gastroesophageal reflux disease)    Hematuria    microscopic   Hemorrhoids    History of recurrent UTIs    Hypertension    Insomnia    Melanoma (HCC) 11/07/2023   Mid chest - needs Mohs   Mild cognitive impairment 2022   Osteoarthritis    Patient Active Problem List   Diagnosis Date Noted   Atypical mole 10/13/2023   Memory impairment 10/11/2021   Depression with anxiety 08/17/2020   Sciatica of right side 10/26/2014   Low back pain 07/12/2014   Idiopathic scoliosis and kyphoscoliosis 08/21/2010   SHINGLES 06/19/2010   ECZEMA 06/19/2010   INSOMNIA 08/11/2009   UNSPECIFIED URINARY INCONTINENCE 06/29/2008   Essential hypertension 07/01/2007   ALLERGIC RHINITIS 07/01/2007   COPD mixed type (HCC) 07/01/2007   GERD 07/01/2007   Osteoarthritis 07/01/2007   Home Medication(s) Prior to Admission medications   Medication Sig Start Date End Date Taking? Authorizing Provider  cefdinir (OMNICEF) 300 MG capsule Take 1 capsule (300 mg  total) by mouth 2 (two) times daily for 7 days. 12/06/23 12/13/23 Yes Lonell Grandchild, MD  cholecalciferol (VITAMIN D3) 25 MCG (1000 UNIT) tablet Take 1,000 Units by mouth daily.   Yes [provider]  FLUoxetine (PROZAC) 20 MG tablet Take 1 tablet (20 mg total) by mouth daily. 11/21/23  Yes Craft, Huston Foley, PA  memantine (NAMENDA) 10 MG tablet Take 1 tablet (10 mg at night) for 2 weeks, then increase to 1 tablet (10 mg) twice a day 12/05/23   Marcos Eke, PA-C                                                                                                                                    Past Surgical History Past Surgical History:  Procedure Laterality Date   ABDOMINAL HYSTERECTOMY     COLONOSCOPY  07/07/08   repeat in 3 yrs Dr. Charm Barges   CYSTOSCOPY  12/21/08   normal Dr. Aldean Ast   Family  History Family History  Problem Relation Age of Onset   Arthritis Other    Breast cancer Other    Colon cancer Other     Social History Social History   Tobacco Use   Smoking status: Never   Smokeless tobacco: Never  Substance Use Topics   Alcohol use: Not Currently    Comment: rare   Drug use: No   Allergies Patient has no known allergies.  Review of Systems Review of Systems  All other systems reviewed and are negative.   Physical Exam Vital Signs  I have reviewed the triage vital signs BP (!) 167/68   Pulse (!) 55   Temp 98.1 F (36.7 C)   Resp 18   SpO2 93%  Physical Exam Vitals and nursing note reviewed.  Constitutional:      General: She is not in acute distress.    Appearance: She is well-developed.  HENT:     Head: Normocephalic and atraumatic.     Mouth/Throat:     Mouth: Mucous membranes are moist.  Eyes:     Pupils: Pupils are equal, round, and reactive to light.  Cardiovascular:     Rate and Rhythm: Normal rate and regular rhythm.     Heart sounds: No murmur heard. Pulmonary:     Effort: Pulmonary effort is normal. No respiratory distress.      Breath sounds: Normal breath sounds.  Abdominal:     General: Abdomen is flat.     Palpations: Abdomen is soft.     Tenderness: There is no abdominal tenderness.  Musculoskeletal:        General: No tenderness.     Right lower leg: No edema.     Left lower leg: No edema.  Skin:    General: Skin is warm and dry.  Neurological:     General: No focal deficit present.     Mental Status: She is alert. Mental status is at baseline.  Psychiatric:        Mood and Affect: Mood normal.        Behavior: Behavior normal.     ED Results and Treatments Labs (all labs ordered are listed, but only abnormal results are displayed) Labs Reviewed  URINE CULTURE - Abnormal; Notable for the following components:      Result Value   Culture   (*)    Value: >=100,000 COLONIES/mL ESCHERICHIA COLI SUSCEPTIBILITIES TO FOLLOW CULTURE REINCUBATED FOR BETTER GROWTH Performed at [facility], [address], [place], [place] [address]    All other components within normal limits  COMPREHENSIVE METABOLIC PANEL WITH GFR - Abnormal; Notable for the following components:   Glucose, Bld 134 (*)    Alkaline Phosphatase 37 (*)    All other components within normal limits  CBC WITH DIFFERENTIAL/PLATELET - Abnormal; Notable for the following components:   Lymphs Abs 0.4 (*)    All other components within normal limits  BRAIN NATRIURETIC PEPTIDE - Abnormal; Notable for the following components:   B Natriuretic Peptide 400.4 (*)    All other components within normal limits  URINALYSIS, ROUTINE W REFLEX MICROSCOPIC - Abnormal; Notable for the following components:   APPearance HAZY (*)    Hgb urine dipstick MODERATE (*)    Ketones, ur 20 (*)    Nitrite POSITIVE (*)    Leukocytes,Ua SMALL (*)    Bacteria, UA RARE (*)    All other components within normal limits  TROPONIN I (HIGH SENSITIVITY)  TROPONIN I (HIGH  SENSITIVITY)                                                                                                                           Radiology No results found.   Pertinent labs & imaging results that were available during my care of the patient were reviewed by me and considered in my medical decision making (see MDM for details).  Medications Ordered in ED Medications  cefTRIAXone (ROCEPHIN) 1 g in sodium chloride 0.9 % 100 mL IVPB (0 g Intravenous Stopped 12/06/23 2346)                                                                                                                                     Procedures Procedures  (including critical care time)  Medical Decision Making / ED Course   MDM:  89 year old presenting to the emergency department with episode of nausea, shortness of breath.  Patient overall well-appearing, physical examination with clear lungs, patient oriented but appears weak and fatigued.  Unclear cause of symptoms, differential includes ACS given nausea, chest tightness, will check troponin x 2.  Differential also includes GERD, no vomiting to suggest esophageal rupture.  Lower concern for pneumonia, pneumothorax but will check chest x-ray.  Patient has reported history of COPD but lungs are clear on exam, patient not hypoxic.  EKG does show sinus bradycardia.  Could possibly be related to the patient's weakness but lower concern this is the primary issue.  Lower concern for CHF, lungs clear on exam, will check chest x-ray and BNP.  Will reassess.  Given generalized weakness also check urinalysis.  Clinical Course as of 12/09/23 0711  Fri Dec 06, 2023  2326 Patient feels much better.  Unclear cause of her earlier episode of symptoms. [WS]  2330 Workup overall reassuring. Patient UA does show findings of possible UTI, on further history patient endorses some dysuria lately. Could be a contributing factor. Since she is feeling much better without any other intervention, troponin negative, feel patient stable for discharge. Patient has remained in sinus  rhythm, lower concern bradycardia is cause of symptoms, feels better without any changes in heart rate. Will discharge patient to home. All questions answered. Patient comfortable with plan of discharge. Return precautions discussed with patient and specified on the after visit summary.  [WS]    Clinical Course User Index [WS] Lonell Grandchild, MD  Additional history obtained: -Additional history obtained from family and ems -External records from outside source obtained and reviewed including: Chart review including previous notes, labs, imaging, consultation notes including prior notes    Lab Tests: -I ordered, reviewed, and interpreted labs.   The pertinent results include:   Labs Reviewed  URINE CULTURE - Abnormal; Notable for the following components:      Result Value   Culture   (*)    Value: >=100,000 COLONIES/mL ESCHERICHIA COLI SUSCEPTIBILITIES TO FOLLOW CULTURE REINCUBATED FOR BETTER GROWTH Performed at [facility], [address], [place], [place] [address]    All other components within normal limits  COMPREHENSIVE METABOLIC PANEL WITH GFR - Abnormal; Notable for the following components:   Glucose, Bld 134 (*)    Alkaline Phosphatase 37 (*)    All other components within normal limits  CBC WITH DIFFERENTIAL/PLATELET - Abnormal; Notable for the following components:   Lymphs Abs 0.4 (*)    All other components within normal limits  BRAIN NATRIURETIC PEPTIDE - Abnormal; Notable for the following components:   B Natriuretic Peptide 400.4 (*)    All other components within normal limits  URINALYSIS, ROUTINE W REFLEX MICROSCOPIC - Abnormal; Notable for the following components:   APPearance HAZY (*)    Hgb urine dipstick MODERATE (*)    Ketones, ur 20 (*)    Nitrite POSITIVE (*)    Leukocytes,Ua SMALL (*)    Bacteria, UA RARE (*)    All other components within normal limits  TROPONIN I (HIGH SENSITIVITY)  TROPONIN I (HIGH SENSITIVITY)    Notable  for signs of UTI, elevated BNP w/o signs of CHF   EKG   EKG Interpretation Date/Time:  Friday December 06 2023 18:58:32 EDT Ventricular Rate:  52 PR Interval:  163 QRS Duration:  96 QT Interval:  484 QTC Calculation: 451 R Axis:   -18  Text Interpretation: Sinus rhythm Ventricular premature complex Borderline left axis deviation Minimal ST depression, lateral leads Confirmed by Alvino Blood [phone]) on 12/06/2023 10:15:42 PM         Imaging Studies ordered: I ordered imaging studies including CXR On my interpretation imaging demonstrates negative findings  I independently visualized and interpreted imaging. I agree with the radiologist interpretation   Medicines ordered and prescription drug management: Meds ordered this encounter  Medications   cefTRIAXone (ROCEPHIN) 1 g in sodium chloride 0.9 % 100 mL IVPB    Antibiotic Indication::   UTI   cefdinir (OMNICEF) 300 MG capsule    Sig: Take 1 capsule (300 mg total) by mouth 2 (two) times daily for 7 days.    Dispense:  14 capsule    Refill:  0    -I have reviewed the patients home medicines and have made adjustments as needed    Cardiac Monitoring: The patient was maintained on a cardiac monitor.  I personally viewed and interpreted the cardiac monitored which showed an underlying rhythm of: sinus bradycardia   Reevaluation: After the interventions noted above, I reevaluated the patient and found that their symptoms have improved  Co morbidities that complicate the patient evaluation  Past Medical History:  Diagnosis Date   Allergy    COPD (chronic obstructive pulmonary disease) (HCC)    Cystocele    Depression    GERD (gastroesophageal reflux disease)    Hematuria    microscopic   Hemorrhoids    History of recurrent UTIs    Hypertension    Insomnia  Melanoma (HCC) 11/07/2023   Mid chest - needs Mohs   Mild cognitive impairment 2022   Osteoarthritis       Dispostion: Disposition decision including  need for hospitalization was considered, and patient discharged from emergency department.    Final Clinical Impression(s) / ED Diagnoses Final diagnoses:  Acute cystitis without hematuria  Generalized weakness     This chart was dictated using voice recognition software.  Despite best efforts to proofread,  errors can occur which can change the documentation meaning.    Lonell Grandchild, MD 12/09/23 [phone]

## 2023-12-06 NOTE — ED Triage Notes (Signed)
 Patient BIB [facility] from fire station for chest tightness, shob, nausea starting an hour ago from now. Patient has no cardiac hx, EKG showed sinus brady at 54, EMS administered, 18g L Ac, 4mg  zofran, 324mg  aspirin, 0.4mg  nitroglycerin, O2 2L [place]. A&Ox4, GCS 15, VSS.

## 2023-12-06 NOTE — Discharge Instructions (Addendum)
 We evaluated you for your episode of weakness and shortness of breath.  Your testing in the emergency department was reassuring.  Your heart testing was negative.  Your symptoms improved in the emergency department.  We did notice your urine was suspicious for urinary infection, so we will treat you for urinary infection.  Please follow-up very closely with your primary doctor.  If you have any recurrent episodes of weakness, feeling faint or like you are going to pass out, fevers, episodes of fainting, difficulty breathing, chest pain, fevers or chills, lightheadedness or dizziness, or any other new symptoms, please return immediately to the emergency department.

## 2023-12-09 ENCOUNTER — Ambulatory Visit: Admitting: Dermatology

## 2023-12-09 ENCOUNTER — Encounter: Payer: Self-pay | Admitting: Dermatology

## 2023-12-09 VITALS — BP 157/70 | HR 54 | Temp 97.7°F

## 2023-12-09 DIAGNOSIS — D213 Benign neoplasm of connective and other soft tissue of thorax: Secondary | ICD-10-CM | POA: Diagnosis not present

## 2023-12-09 DIAGNOSIS — D492 Neoplasm of unspecified behavior of bone, soft tissue, and skin: Secondary | ICD-10-CM

## 2023-12-09 DIAGNOSIS — C4359 Malignant melanoma of other part of trunk: Secondary | ICD-10-CM | POA: Diagnosis not present

## 2023-12-09 DIAGNOSIS — C4371 Malignant melanoma of right lower limb, including hip: Secondary | ICD-10-CM | POA: Diagnosis not present

## 2023-12-09 DIAGNOSIS — C439 Malignant melanoma of skin, unspecified: Secondary | ICD-10-CM | POA: Insufficient documentation

## 2023-12-09 DIAGNOSIS — D485 Neoplasm of uncertain behavior of skin: Secondary | ICD-10-CM

## 2023-12-09 DIAGNOSIS — L579 Skin changes due to chronic exposure to nonionizing radiation, unspecified: Secondary | ICD-10-CM

## 2023-12-09 DIAGNOSIS — L814 Other melanin hyperpigmentation: Secondary | ICD-10-CM

## 2023-12-09 LAB — URINE CULTURE: Culture: >=100000 — AB

## 2023-12-09 NOTE — Progress Notes (Unsigned)
 Follow-Up Visit   Subjective  Stacey Melton is a 89 y.o. female who presents for the following: Mohs of an invasive Melanoma (0.3 mm) on the mid chest, biopsied by Dr. Caralyn Guile. She is accompanied by her daughter.   She is also reports a lesion of concern on the right plantar foot, painful, noticed by her other daughter less than a week ago.   The following portions of the chart were reviewed this encounter and updated as appropriate: medications, allergies, medical history  Review of Systems:  No other skin or systemic complaints except as noted in HPI or Assessment and Plan.  Objective  Well appearing patient in no apparent distress; mood and affect are within normal limits.  A focused examination was performed of the following areas: Mid chest Relevant physical exam findings are noted in the Assessment and Plan.   mid chest Healing biopsy site  Right Middle Plantar Surface 7 mm blue black nodule with a larger subtle mottled pigmented patch at the periphery    Assessment & Plan   MALIGNANT MELANOMA OF SKIN (HCC) mid chest Mohs surgery  Consent obtained: written  Anticoagulation: Is the patient taking prescription anticoagulant and/or aspirin prescribed/recommended by a physician? No   Was the anticoagulation regimen changed prior to Mohs? No    Anesthesia: Anesthesia method: local infiltration Local anesthetic: lidocaine 1% WITH epi  Procedure Details: Timeout: pre-procedure verification complete Procedure Prep: patient was prepped and draped in usual sterile fashion Prep type: chlorhexidine Biopsy accession number: [id number] Biopsy lab: [facility] Specimen debulked: Yes   Pre-Op diagnosis: melanoma Melanoma subtype: invasive Melanoma Breslow depth (mm): 3 MohsAIQ Surgical site (if tumor spans multiple areas, please select predominant area): trunk (excluding nipple/areola) Surgery side: midline Surgical site (from skin exam): mid chest Pre-operative length (cm):  1.6 Pre-operative width (cm): 1.5 Indications for Mohs surgery: anatomic location where tissue conservation is critical and aggressive histology Previously treated? No    Micrographic Surgery Details: Number of Mohs stages: 2  Stage 1    Tumor features identified on Mohs section: melanoma    Depth of defect after stage: dermis  Patient tolerance of procedure: tolerated well, no immediate complications  Reconstruction: Was the defect reconstructed? Yes   Was reconstruction performed by the same Mohs surgeon? Yes   Setting of reconstruction: outpatient office When was reconstruction performed? same day  Opioids: Did the patient receive a prescription for opioid/narcotic related to Mohs surgery? Yes   Indications for opioid/narcotics: patient required additional pain relief despite trial of non-opioid analgesia  Antibiotics: Does patient meet AHA guidelines for endocarditis?: No   Does patient meet AHA guidelines for orthopedic prophylaxis?: No   Were antibiotics given on the day of surgery?: No   Did surgery breach mucosa, expose cartilage/bone, involve an area of lymphedema/inflamed/infected tissue? No   Specimen 2 - Surgical pathology Specimen # 1 - Depth assessment for residual invasive disease in fat layer Stage 1  Specimen #2 - Margin assessment for Slow Mohs due to equipment failure NEOPLASM OF UNCERTAIN BEHAVIOR OF SKIN Right Middle Plantar Surface Skin / nail biopsy Type of biopsy: tangential   Informed consent: discussed and consent obtained   Timeout: patient name, date of birth, surgical site, and procedure verified   Procedure prep:  Patient was prepped and draped in usual sterile fashion Prep type:  Isopropyl alcohol Anesthesia: the lesion was anesthetized in a standard fashion   Anesthetic:  1% lidocaine w/ epinephrine 1-100,000 buffered w/ 8.4% NaHCO3 Instrument used: DermaBlade  Hemostasis achieved with: aluminum chloride   Outcome: patient tolerated  procedure well   Post-procedure details: sterile dressing applied and wound care instructions given   Dressing type: bandage   Specimen 1 - Surgical pathology Differential Diagnosis: r/o MM vs other 2 pieces of tissue  Check Margins: No   Return in 1 day (on 12/10/2023) for mohs follow and reconstruction.  Cassandria Santee, Surg Tech III, am acting as scribe for Gwenith Daily, MD.    12/13/2023  HISTORY OF PRESENT ILLNESS  Stacey Melton is seen in consultation at the request of Dr. Caralyn Guile for biopsy-proven Invasive Melanoma (0.3 mm) on the mid chest. They note that the area has been present for about 6 months increasing in size with time.  There is no history of previous treatment.  Reports no other new or changing lesions and has no other complaints today.  Medications and allergies: see patient chart.  Review of systems: Reviewed 8 systems and notable for the above skin cancer.  All other systems reviewed are unremarkable/negative, unless noted in the HPI. Past medical history, surgical history, family history, social history were also reviewed and are noted in the chart/questionnaire.    PHYSICAL EXAMINATION  General: Well-appearing, in no acute distress, alert and oriented x 4. Vitals reviewed in chart (if available).   Skin: Exam reveals a 1.6 x 1.5 cm erythematous papule and biopsy scar on the mid chest. There are rhytids, telangiectasias, and lentigines, consistent with photodamage.   Biopsy report(s) reviewed, confirming the diagnosis.   ASSESSMENT  1) Invasive Melanoma on the mid chest 2) photodamage 3) solar lentigines   PLAN   1. Due to location, size, histology, or recurrence and the likelihood of subclinical extension as well as the need to conserve normal surrounding tissue, the patient was deemed acceptable for Mohs micrographic surgery (MMS).  The nature and purpose of the procedure, associated benefits and risks including recurrence and scarring, possible  complications such as pain, infection, and bleeding, and alternative methods of treatment if appropriate were discussed with the patient during consent. The lesion location was verified by the patient, by reviewing previous notes, pathology reports, and by photographs as well as angulation measurements if available.  Informed consent was reviewed and signed by the patient, and timeout was performed at 8:30 AM. See op note below.  2. For the photodamage and solar lentigines, sun protection discussed/information given on OTC sunscreens, and we recommend continued regular follow-up with primary dermatologist every 6 months or sooner for any growing, bleeding, or changing lesions. 3. Prognosis and future surveillance discussed. 4. Letter with treatment outcome sent to referring provider. 5. Pain acetaminophen/ibuprofen   MOHS MICROGRAPHIC SURGERY AND RECONSTRUCTION  Initial size:   1.6 x 1.5 cm Surgical defect/wound size: 4.6 x 4.3 cm Anesthesia:    0.33% lidocaine with 1:200,000 epinephrine EBL:    <5 mL Complications:  None Repair type:   Delayed until case clears   Stages: 2  STAGE I: Anesthesia achieved with 0.5% lidocaine with 1:200,000 epinephrine. ChloraPrep applied. 5 section(s) excised using Mohs technique (this includes total peripheral and deep tissue margin excision and evaluation with frozen sections, excised and interpreted by the same physician). The tumor was first debulked and then excised with an approx. 2 mm margin.  Hemostasis was achieved with electrocautery as needed.  The specimen was then oriented, subdivided/relaxed, inked, and processed using Mohs technique.    Frozen section analysis revealed a positive margin for atypical proliferation of melanocytes arranged in a  confluent and pagetoid pattern along the basal and suprabasal layers. The melanocytes exhibit nuclear pleomorphism, hyperchromasia, and prominent nucleoli. There is an increased number of melanocytes in the  dermis with focal nesting and single-cell spread in the deep margin. Tissue was stained with H&E and MART-1 and SOX-10 immunostains (2 separate antibodies).  STAGE II: An additional 2 mm margin was excised.  Hemostasis was achieved with electrocautery as needed.  The specimen was then oriented, subdivided/relaxed, inked, and processed using Mohs technique.  During the second stage of Mohs surgery, it was necessary to convert the procedure to Slow Mohs due to an equipment failure. Specifically, the stainer used for processing the slides was unable to stain the tissue samples to the required standards, which delayed the analysis. As a result, the decision was made to proceed with permanent sections to allow for manual processing and ensure proper examination of the margins. This change was necessary to maintain the highest quality of care and ensure accurate results. The patient was informed of the situation, and the procedure continued in accordance with the adjusted plan. Further updates will be provided as the case progresses.   Documentation: I have reviewed the above documentation for accuracy and completeness, and I agree with the above.  Gwenith Daily, MD

## 2023-12-09 NOTE — Patient Instructions (Signed)

## 2023-12-10 ENCOUNTER — Ambulatory Visit (INDEPENDENT_AMBULATORY_CARE_PROVIDER_SITE_OTHER): Admitting: Dermatology

## 2023-12-10 ENCOUNTER — Telehealth (HOSPITAL_BASED_OUTPATIENT_CLINIC_OR_DEPARTMENT_OTHER): Payer: Self-pay

## 2023-12-10 ENCOUNTER — Encounter: Payer: Self-pay | Admitting: Dermatology

## 2023-12-10 DIAGNOSIS — C439 Malignant melanoma of skin, unspecified: Secondary | ICD-10-CM

## 2023-12-10 DIAGNOSIS — C4359 Malignant melanoma of other part of trunk: Secondary | ICD-10-CM | POA: Diagnosis not present

## 2023-12-10 NOTE — Telephone Encounter (Signed)
 Post ED Visit - Positive Culture Follow-up  Culture report reviewed by antimicrobial stewardship pharmacist: [facility] []  Enzo Bi, Pharm.D. []  Celedonio Miyamoto, Pharm.D., BCPS AQ-ID []  Garvin Fila, Pharm.D., BCPS []  Georgina Pillion, [address].D., BCPS []  [place], [address].D., BCPS, AAHIVP []  Estella Husk, Pharm.D., BCPS, AAHIVP []  Lysle Pearl, PharmD, BCPS []  Phillips Climes, PharmD, BCPS []  Agapito Games, PharmD, BCPS []  Verlan Friends, PharmD []  Mervyn [name], PharmD, BCPS []  Vinnie Level, PharmD X   Lora Paula, PharmD  [facility] []  Len Childs, PharmD []  Greer Pickerel, PharmD []  Adalberto Cole, PharmD []  Perlie Gold, Rph []  Lonell Face) Jean Rosenthal, PharmD []  Earl Many, PharmD []  Junita Push, PharmD []  Dorna Leitz, PharmD []  Terrilee Files, PharmD []  Lynann Beaver, PharmD []  Keturah Barre, PharmD []  Loralee Pacas, PharmD []  Bernadene Person, PharmD  Positive urine culture Treated with Cefdinir, organism sensitive to the same and no further patient follow-up is required at this time.  "Ok"  Arvid Right 12/10/2023, 9:50 AM

## 2023-12-10 NOTE — Progress Notes (Signed)
   Follow-Up Visit   Subjective  Stacey Melton is a 89 y.o. female who presents for the following: reconstruction from a Mohs for Invasive Melanoma on the mid chest. Patient's daughter is at the bedside.  The following portions of the chart were reviewed this encounter and updated as appropriate: medications, allergies, medical history  Review of Systems:  No other skin or systemic complaints except as noted in HPI or Assessment and Plan.  Objective  Well appearing patient in no apparent distress; mood and affect are within normal limits.  A focused examination was performed of the following areas: Mid chest Relevant physical exam findings are noted in the Assessment and Plan.     Assessment & Plan   Invasive Melanoma (0.3 mm) of the mid chest  The patient was counseled regarding the results of the second stage of Mohs surgery for melanoma. The margins were found to be negative, indicating that the cancerous tissue has been completely excised.  Due to an equipment failure during the procedure, the tissue was sent for permanent sectioning to [facility] for further processing and confirmation of the negative margins.  RECONSTRUCTION  Surgical defect/wound size: 4.6 x 4.3 cm Anesthesia:    0.33% lidocaine with 1:200,000 epinephrine EBL:    <5 mL Complications:  None Repair type:   Adjacent Tissue Transfer (Tripolar Advancement Flap) SQ suture:   3-0 Monocryl Cutaneous suture:  Dermabond and Steri Strips Final size of the repair: 8.7 x 5.0 = 43.5 cm^2  Reconstruction  PROCEDURE: Advancement Flap The nature of the procedure was discussed with the patient in detail, including alternatives.  The risks discussed included but not limited to potential for infection, bleeding, scar formation, and damage to underlying structures.  The patient understood the risks and signed the consent form (scanned into chart).  This wound was reconstructed with an advancement flap.  Local anesthesia was  achieved with the anesthetic indicated above.  The operative site was prepped with a surgical antiseptic solution, and then draped with sterile towels to insure a sterile field.  The beveled edges of the wound were excised to 90 degrees relative to the surface skin plane.  The wound was undermined in all directions, and meticulous hemostasis was achieved with an electrosurgical device.  Relaxing incisions were made, if necessary, for lateral tension release.  The tissue was advanced and closed centrally, and redundant tissue was trimmed as necessary.  The wound was sutured in a layered fashion to close potential dead space and to precisely and securely approximate the wound edges.  The dimensions of the flap were:  8.7  cm x 5.0 cm for a total flap surface area of 43.5 centimeters squared (cm2).    The wound was covered with petrolatum and a dressing.  The patient understands the need to return immediately for any signs of infection to include swelling, pain, purulent discharge, localized warmth, or fever.  Contact information was provided to the patient (including after-hours pager numbers)    Return in about 4 weeks (around 01/07/2024) for Follow up.  I, Joanie Coddington, CMA, am acting as scribe for Gwenith Daily, MD .   Documentation: I have reviewed the above documentation for accuracy and completeness, and I agree with the above.  Gwenith Daily, MD

## 2023-12-10 NOTE — Patient Instructions (Signed)

## 2023-12-12 LAB — SURGICAL PATHOLOGY

## 2023-12-13 ENCOUNTER — Other Ambulatory Visit: Payer: Self-pay | Admitting: Dermatology

## 2023-12-13 DIAGNOSIS — C439 Malignant melanoma of skin, unspecified: Secondary | ICD-10-CM

## 2023-12-13 NOTE — Progress Notes (Signed)
 Called and spoke with patient's daughter, Stacey Melton. Relayed bx results of right plantar surface showing 2.3 mm melanoma. Recommend referral to Oncology with Dr. Cherly Hensen to discuss further work up, including likely PET scan. Patient's daughter understands. Referral placed.

## 2023-12-17 ENCOUNTER — Encounter: Payer: Self-pay | Admitting: Dermatology

## 2023-12-17 NOTE — Progress Notes (Signed)
 [facility] CONSULT NOTE  Patient Care Team: Odilia Bennett, [place] as PCP - General (Physician Assistant)  ASSESSMENT & PLAN:  Abrey is a 89 y.o.female with history of melanoma, GERD, HTN, memory loss being seen at [facility] for invasive melanoma.  Patient has two locations of biopsy proven melanoma. One on the chest pT1a 0.3 mm. The other one over right middle plantar area with biopsy and showed malignant melanoma. At least pT3a with Breslow's depth of at least 2.3 mm.   Clinically with new short of breath, fatigue and decreased appetite and weight loss. I recommend obtain staging study with two biopsy proven melanoma. If distant metastases suspected on PET scan, then biopsy will be recommended. If no distant metastatic disease identify, will refer for WLE and SLNB. Assessment & Plan Malignant melanoma of skin (HCC) PET ordered Follow up after PET  Orders Placed This Encounter  Procedures   NM PET Image Initial (PI) Whole Body    Standing Status:   Future    Expected Date:   12/27/2023    Expiration Date:   12/22/2024    If indicated for the ordered procedure, I authorize the administration of a radiopharmaceutical per Radiology protocol:   Yes    Preferred imaging location?:   [facility]   All questions were answered. The patient knows to call the clinic with any problems, questions or concerns. No barriers to learning was detected.  Lowanda Ruddy, MD 4/21/20251:14 PM  CHIEF COMPLAINTS/PURPOSE OF CONSULTATION:  melanoma  HISTORY OF PRESENTING ILLNESS:  Buna Cuppett 89 y.o. female is here because of melanoma.  11/07/23 Patient had skin biopsy on mid chest showed malignant melanoma 0.3 mm Breslow's depth, negative ulceration., pT1a.   On 12/09/23 she had right middle plantar skin biopsy and showed malignant melanoma. At least pT3a with Breslow's depth of at least 2.3 mm. +margin. No ulceration.  12/10/23 dermpath showed negative for residual melanoma  at mid chest.  MEDICAL HISTORY:  Past Medical History:  Diagnosis Date   Allergy    COPD (chronic obstructive pulmonary disease) (HCC)    Cystocele    Depression    GERD (gastroesophageal reflux disease)    Hematuria    microscopic   Hemorrhoids    History of recurrent UTIs    Hypertension    Insomnia    Melanoma (HCC) 11/07/2023   Mid chest - needs Mohs   Mild cognitive impairment 2022   Osteoarthritis     SURGICAL HISTORY: Past Surgical History:  Procedure Laterality Date   ABDOMINAL HYSTERECTOMY     COLONOSCOPY  07/07/08   repeat in 3 yrs Dr. Randal Bury   CYSTOSCOPY  12/21/08   normal Dr. Enrigue Harvard    SOCIAL HISTORY: Social History   Socioeconomic History   Marital status: Widowed    Spouse name: Not on file   Number of children: 5   Years of education: Not on file   Highest education level: 10th grade  Occupational History   Not on file  Tobacco Use   Smoking status: Never   Smokeless tobacco: Never  Substance and Sexual Activity   Alcohol use: Not Currently    Comment: rare   Drug use: No   Sexual activity: Not Currently  Other Topics Concern   Not on file  Social History Narrative   Right handed   Drinks caffeine   Lives alone   One floor home   Social Drivers of Health   Financial Resource Strain: Low Risk  (  10/28/2022)   Overall Financial Resource Strain (CARDIA)    Difficulty of Paying Living Expenses: Not hard at all  Food Insecurity: No Food Insecurity (10/28/2022)   Hunger Vital Sign    Worried About Running Out of Food in the Last Year: Never true    Ran Out of Food in the Last Year: Never true  Transportation Needs: No Transportation Needs (10/28/2022)   PRAPARE - Administrator, Civil Service (Medical): No    Lack of Transportation (Non-Medical): No  Physical Activity: Unknown (10/28/2022)   Exercise Vital Sign    Days of Exercise per Week: 0 days    Minutes of Exercise per Session: Not on file  Recent Concern: Physical  Activity - Inactive (10/28/2022)   Exercise Vital Sign    Days of Exercise per Week: 0 days    Minutes of Exercise per Session: 0 min  Stress: Stress Concern Present (10/28/2022)   [employer] of Occupational Health - Occupational Stress Questionnaire    Feeling of Stress : Very much  Social Connections: Socially Isolated (10/28/2022)   Social Connection and Isolation Panel [NHANES]    Frequency of Communication with Friends and Family: More than three times a week    Frequency of Social Gatherings with Friends and Family: Once a week    Attends Religious Services: Never    Database administrator or Organizations: No    Attends Engineer, structural: Not on file    Marital Status: Widowed  Intimate Partner Violence: Not At Risk (03/29/2022)   Humiliation, Afraid, Rape, and Kick questionnaire    Fear of Current or Ex-Partner: No    Emotionally Abused: No    Physically Abused: No    Sexually Abused: No    FAMILY HISTORY: Family History  Problem Relation Age of Onset   Arthritis Other    Breast cancer Other    Colon cancer Other     ALLERGIES:  has no known allergies.  MEDICATIONS:  Current Outpatient Medications  Medication Sig Dispense Refill   cholecalciferol (VITAMIN D3) 25 MCG (1000 UNIT) tablet Take 1,000 Units by mouth daily.     cyanocobalamin  (VITAMIN B12) 1000 MCG tablet Take 1,000 mcg by mouth daily.     FLUoxetine  (PROZAC ) 20 MG tablet Take 1 tablet (20 mg total) by mouth daily. 90 tablet 3   memantine  (NAMENDA ) 10 MG tablet Take 1 tablet (10 mg at night) for 2 weeks, then increase to 1 tablet (10 mg) twice a day 60 tablet 11   No current facility-administered medications for this visit.    REVIEW OF SYSTEMS:   All relevant systems were reviewed with the patient and are negative.  PHYSICAL EXAMINATION: ECOG PERFORMANCE STATUS: 2 - Symptomatic, <50% confined to bed  Vitals:   12/23/23 1234  BP: (!) 157/96  Pulse: (!) 59  Resp: 20  Temp: 97.6 F  (36.4 C)  SpO2: 96%   Filed Weights   12/23/23 1234  Weight: 167 lb 3.2 oz (75.8 kg)    GENERAL: alert, no distress and comfortable SKIN: skin color is normal, no jaundice EYES: sclera clear OROPHARYNX: no exudate, no erythema NECK: supple LYMPH:  no palpable lymphadenopathy in the cervical, axillary and inguinal regions LUNGS: Effort normal, no respiratory distress.  Clear to auscultation bilaterally ABDOMEN: soft, non-tender and nondistended NEURO: no focal motor/sensory deficits  LABORATORY DATA:  I have reviewed the data as listed Lab Results  Component Value Date   WBC 7.7 12/06/2023  HGB 14.3 12/06/2023   HCT 44.8 12/06/2023   MCV 90.0 12/06/2023   PLT 199 12/06/2023   Recent Labs    10/09/23 1553 12/06/23 1931  NA 147* 140  K 5.2 4.6  CL 106 105  CO2 26 27  GLUCOSE 85 134*  BUN 20 17  CREATININE 0.78 0.91  CALCIUM 9.8 9.4  GFRNONAA  --  >60  PROT 6.7 6.6  ALBUMIN 4.4 4.0  AST 20 18  ALT 14 18  ALKPHOS 58 37*  BILITOT 0.6 0.8    RADIOGRAPHIC STUDIES: I have personally reviewed the radiological images as listed and agreed with the findings in the report. DG Chest Port 1 View Result Date: 12/06/2023 CLINICAL DATA:  Cough EXAM: PORTABLE CHEST 1 VIEW COMPARISON:  09/13/2021 FINDINGS: Cardiomegaly. No acute airspace disease, pleural effusion or pneumothorax. Aortic atherosclerosis IMPRESSION: No active disease. Cardiomegaly. Electronically Signed   By: Esmeralda Hedge M.D.   On: 12/06/2023 20:04   CT Head Wo Contrast Result Date: 12/06/2023 CLINICAL DATA:  Mental status change. EXAM: CT HEAD WITHOUT CONTRAST TECHNIQUE: Contiguous axial images were obtained from the base of the skull through the vertex without intravenous contrast. RADIATION DOSE REDUCTION: This exam was performed according to the departmental dose-optimization program which includes automated exposure control, adjustment of the mA and/or kV according to patient size and/or use of iterative  reconstruction technique. COMPARISON:  None Available. FINDINGS: Brain: No acute infarct, hemorrhage, or mass lesion is present. Mild atrophy and white matter changes are likely within normal limits for age. Deep brain nuclei are within normal limits. The ventricles are of normal size. No significant extraaxial fluid collection is present. Midline structures are within normal limits. The brainstem and cerebellum are within normal limits. Vascular: Atherosclerotic calcifications are present within the cavernous internal carotid arteries bilaterally. No hyperdense vessel is present. Skull: Calvarium is intact. No focal lytic or blastic lesions are present. No significant extracranial soft tissue lesion is present. Sinuses/Orbits: The paranasal sinuses and mastoid air cells are clear. Bilateral lens replacements are noted. Globes and orbits are otherwise unremarkable. IMPRESSION: 1. No acute intracranial abnormality or significant interval change. 2. Mild atrophy and white matter changes are likely within normal limits for age. Electronically Signed   By: Audree Leas M.D.   On: 12/06/2023 20:00

## 2023-12-20 ENCOUNTER — Ambulatory Visit
Admission: RE | Admit: 2023-12-20 | Discharge: 2023-12-20 | Disposition: A | Discharge status: Home / Self Care | Source: Ambulatory Visit | Attending: Physician Assistant | Admitting: Physician Assistant

## 2023-12-20 DIAGNOSIS — R413 Other amnesia: Secondary | ICD-10-CM | POA: Diagnosis not present

## 2023-12-23 ENCOUNTER — Inpatient Hospital Stay

## 2023-12-23 VITALS — BP 157/96 | HR 59 | Temp 97.6°F | Resp 20 | Wt 167.2 lb

## 2023-12-23 DIAGNOSIS — C4371 Malignant melanoma of right lower limb, including hip: Secondary | ICD-10-CM | POA: Insufficient documentation

## 2023-12-23 DIAGNOSIS — I10 Essential (primary) hypertension: Secondary | ICD-10-CM | POA: Insufficient documentation

## 2023-12-23 DIAGNOSIS — K219 Gastro-esophageal reflux disease without esophagitis: Secondary | ICD-10-CM | POA: Diagnosis not present

## 2023-12-23 DIAGNOSIS — C4359 Malignant melanoma of other part of trunk: Secondary | ICD-10-CM | POA: Insufficient documentation

## 2023-12-23 DIAGNOSIS — R413 Other amnesia: Secondary | ICD-10-CM | POA: Insufficient documentation

## 2023-12-23 DIAGNOSIS — C439 Malignant melanoma of skin, unspecified: Secondary | ICD-10-CM

## 2023-12-23 NOTE — Assessment & Plan Note (Signed)
 PET ordered Follow up after PET

## 2023-12-25 ENCOUNTER — Telehealth: Payer: Self-pay

## 2023-12-25 NOTE — Telephone Encounter (Signed)
 Patient called to confirm Stacey Melton's appointments, [name] stated that she will have one of her siblings come in with her mom on Tuesday as she will be out of town.

## 2023-12-25 NOTE — Telephone Encounter (Signed)
 Scheduled patient's appts. Advised patient to contact us if rescheduling is needed. Provided my direct line.

## 2023-12-26 ENCOUNTER — Encounter (HOSPITAL_COMMUNITY)
Admission: RE | Admit: 2023-12-26 | Discharge: 2023-12-26 | Disposition: A | Discharge status: Home / Self Care | Source: Ambulatory Visit

## 2023-12-26 DIAGNOSIS — C439 Malignant melanoma of skin, unspecified: Secondary | ICD-10-CM | POA: Diagnosis not present

## 2023-12-26 DIAGNOSIS — C73 Malignant neoplasm of thyroid gland: Secondary | ICD-10-CM | POA: Diagnosis not present

## 2023-12-26 LAB — GLUCOSE, CAPILLARY: Glucose-Capillary: 94 mg/dL (ref 70–99)

## 2023-12-26 MED ORDER — FLUDEOXYGLUCOSE F - 18 (FDG) INJECTION
8.3000 | Freq: Once | INTRAVENOUS | Status: AC
  Administered 2023-12-26: 8.07 via INTRAVENOUS

## 2023-12-27 ENCOUNTER — Telehealth: Payer: Self-pay

## 2023-12-27 ENCOUNTER — Other Ambulatory Visit: Payer: Self-pay

## 2023-12-27 DIAGNOSIS — E279 Disorder of adrenal gland, unspecified: Secondary | ICD-10-CM

## 2023-12-27 DIAGNOSIS — E041 Nontoxic single thyroid nodule: Secondary | ICD-10-CM

## 2023-12-27 NOTE — Progress Notes (Deleted)
 [facility] OFFICE PROGRESS NOTE  Patient Care Team: Odilia Bennett, [place] as PCP - General (Physician Assistant)  Assessment & Plan   No orders of the defined types were placed in this encounter.    Lowanda Ruddy, MD  INTERVAL HISTORY:  Oncology History   No history exists.     PHYSICAL EXAMINATION: ECOG PERFORMANCE STATUS: {CHL ONC ECOG PS:[phone]}  There were no vitals filed for this visit. There were no vitals filed for this visit.  Relevant data reviewed during this visit included ***

## 2023-12-27 NOTE — Telephone Encounter (Signed)
 Stacey Melton returned my call, Dr. Alita Irwin agreed to speak with Stacey Melton on behalf of [name]. Dr. Alita Irwin informed me to cancel [name]'s appointment scheduled for 4/29 as the phone call will replace that appointment.

## 2023-12-27 NOTE — Telephone Encounter (Signed)
 I informed Stacey Melton that Dr. Alita Irwin would like to schedule a telephone visit on today if she has availability. I provided my direct line for a return call.

## 2023-12-27 NOTE — Progress Notes (Signed)
 MRI ordered for left adrenal gland with associated low level metabolic activity.  US  neck for avid thyroid  nodule on PET, and FNA for diagnosis

## 2023-12-27 NOTE — Telephone Encounter (Signed)
 [name] called back to confirm that [name]'s appointment for 4/29 has been cancelled.

## 2023-12-30 NOTE — Progress Notes (Signed)
 Per staff message below , message has been sent to ordering provider - Dr. Alita Irwin and he is aware to order US  thyroid  first.     __________________________________________________________________ [name], MD  Stacey Melton This patient needs a dx US  THYROID  first to confirm the lesion.  Biopsy can be requested after that.  HKM       Previous Messages    ----- Message ----- From: Siham Bucaro Sent: 12/27/2023   1:21 PM EDT To: Briarrose Shor; Ir Procedure Requests Subject: US  FNA BX THYROID  1ST LESION AFIRMA            Procedure : US  FNA BX THYROID  1ST LESION AFIRMA  Reason: avid nodule posteriorly in the right thyroid  lobe needs to rule out metastasis Dx: Right thyroid  nodule [E04.1 (ICD-10-CM)]    History : NM PET initial whole body , CT Head wo , MR brain wo  Provider : Lowanda Ruddy, MD  Provider contact : [phone]      ----- US  Soft tissue head and neck ordered as well as MR abdomen- not sure if those are need prior to biopsy review

## 2023-12-31 ENCOUNTER — Ambulatory Visit

## 2024-01-08 ENCOUNTER — Ambulatory Visit: Payer: Medicare Other | Admitting: Physician Assistant

## 2024-01-10 ENCOUNTER — Ambulatory Visit (HOSPITAL_COMMUNITY)
Admission: RE | Admit: 2024-01-10 | Discharge: 2024-01-10 | Disposition: A | Discharge status: Home / Self Care | Source: Ambulatory Visit

## 2024-01-10 ENCOUNTER — Other Ambulatory Visit: Payer: Self-pay

## 2024-01-10 DIAGNOSIS — E041 Nontoxic single thyroid nodule: Secondary | ICD-10-CM

## 2024-01-10 DIAGNOSIS — E279 Disorder of adrenal gland, unspecified: Secondary | ICD-10-CM

## 2024-01-10 DIAGNOSIS — C799 Secondary malignant neoplasm of unspecified site: Secondary | ICD-10-CM | POA: Diagnosis not present

## 2024-01-10 DIAGNOSIS — E278 Other specified disorders of adrenal gland: Secondary | ICD-10-CM | POA: Diagnosis not present

## 2024-01-10 DIAGNOSIS — R935 Abnormal findings on diagnostic imaging of other abdominal regions, including retroperitoneum: Secondary | ICD-10-CM | POA: Diagnosis not present

## 2024-01-10 DIAGNOSIS — E042 Nontoxic multinodular goiter: Secondary | ICD-10-CM | POA: Diagnosis not present

## 2024-01-10 DIAGNOSIS — K8020 Calculus of gallbladder without cholecystitis without obstruction: Secondary | ICD-10-CM | POA: Diagnosis not present

## 2024-01-10 MED ORDER — GADOBUTROL 1 MMOL/ML IV SOLN
7.0000 mL | Freq: Once | INTRAVENOUS | Status: AC | PRN
Start: 2024-01-10 — End: 2024-01-10
  Administered 2024-01-10: 7 mL via INTRAVENOUS

## 2024-01-20 ENCOUNTER — Ambulatory Visit: Payer: Self-pay

## 2024-01-21 ENCOUNTER — Ambulatory Visit: Admitting: Dermatology

## 2024-01-21 ENCOUNTER — Encounter: Payer: Self-pay | Admitting: Dermatology

## 2024-01-21 DIAGNOSIS — C4371 Malignant melanoma of right lower limb, including hip: Secondary | ICD-10-CM | POA: Diagnosis not present

## 2024-01-21 DIAGNOSIS — L905 Scar conditions and fibrosis of skin: Secondary | ICD-10-CM

## 2024-01-21 DIAGNOSIS — C439 Malignant melanoma of skin, unspecified: Secondary | ICD-10-CM

## 2024-01-21 NOTE — Patient Instructions (Signed)

## 2024-01-21 NOTE — Progress Notes (Signed)
   Follow Up Visit   Subjective  Stacey Melton is a 89 y.o. female who presents for the following: follow up from Mohs surgery; she is accompanied by her daughter. She is undergoing workup with Dr. Alita Irwin for invasive melanoma of the right plantar surface.   The patient presents for follow up from Mohs surgery for a MM on the mid chest, treated on 12/10/23, repaired with advancement flap. The patient has been bandaging the wound as directed. The endorse the following concerns: none  The following portions of the chart were reviewed this encounter and updated as appropriate: medications, allergies, medical history  Review of Systems:  No other skin or systemic complaints except as noted in HPI or Assessment and Plan.  Objective  Well appearing patient in no apparent distress; mood and affect are within normal limits.  A focal examination was performed including scalp, head, face and mid chest. All findings within normal limits unless otherwise noted below.  Healing wound with mild erythema  Relevant physical exam findings are noted in the Assessment and Plan.    Assessment & Plan   Healing s/p Mohs for MM, treated on 12/10/23, repaired with advancement flap - Reassured that wound is healing well - No evidence of infection - No swelling, induration, purulence, dehiscence, or tenderness out of proportion to the clinical exam, see photo above - Discussed that scars take up to 12 months to mature from the date of surgery - Recommend SPF 30+ to scar daily to prevent purple color from UV exposure during scar maturation process - Discussed that erythema and raised appearance of scar will fade over the next 4-6 months - OK to start scar massage at 4-6 weeks post-op - Can consider silicone based products for scar healing starting at 6 weeks post-op - Ok to continue ointment daily to wound under a bandage for another week  Invasive Melanoma of the right plantar surface Counseled the patient  regarding the diagnosis of invasive melanoma of the right plantar surface with a depth >2 mm. The patient has completed the recommended imaging studies, including PET-CT, MRI, and regional lymph node ultrasound, as part of the staging workup. Dr. Alita Irwin (oncology) is scheduled to follow up with the patient in several days to review the imaging results and discuss further treatment options, which may include surgical intervention, immunotherapy, or additional management depending on the findings. The patient understands the current plan and is awaiting results.  Return in 3 months (on 04/22/2024) for TBSE.  I, Wilson Hasten, CMA, am acting as scribe for Stacey Finlay, MD.   Documentation: I have reviewed the above documentation for accuracy and completeness, and I agree with the above.  Stacey Finlay, MD

## 2024-01-23 ENCOUNTER — Inpatient Hospital Stay

## 2024-01-23 ENCOUNTER — Other Ambulatory Visit: Payer: Self-pay

## 2024-01-23 ENCOUNTER — Ambulatory Visit
Admission: RE | Admit: 2024-01-23 | Discharge: 2024-01-23 | Disposition: A | Discharge status: Home / Self Care | Source: Ambulatory Visit

## 2024-01-23 ENCOUNTER — Telehealth: Payer: Self-pay

## 2024-01-23 ENCOUNTER — Other Ambulatory Visit (HOSPITAL_COMMUNITY)
Admission: RE | Admit: 2024-01-23 | Discharge: 2024-01-23 | Disposition: A | Discharge status: Home / Self Care | Source: Ambulatory Visit

## 2024-01-23 DIAGNOSIS — E041 Nontoxic single thyroid nodule: Secondary | ICD-10-CM | POA: Diagnosis not present

## 2024-01-23 NOTE — Telephone Encounter (Signed)
 Left a voicemail with the appointment details.

## 2024-01-24 ENCOUNTER — Ambulatory Visit: Payer: Self-pay

## 2024-01-24 LAB — CYTOLOGY - NON PAP

## 2024-01-29 ENCOUNTER — Ambulatory Visit (INDEPENDENT_AMBULATORY_CARE_PROVIDER_SITE_OTHER): Payer: Medicare Other | Admitting: Physician Assistant

## 2024-01-29 ENCOUNTER — Telehealth: Payer: Self-pay

## 2024-01-29 ENCOUNTER — Encounter: Payer: Self-pay | Admitting: Physician Assistant

## 2024-01-29 VITALS — BP 112/60 | HR 81 | Temp 97.6°F | Ht 65.0 in | Wt 164.2 lb

## 2024-01-29 DIAGNOSIS — F01511 Vascular dementia, unspecified severity, with agitation: Secondary | ICD-10-CM | POA: Diagnosis not present

## 2024-01-29 DIAGNOSIS — R413 Other amnesia: Secondary | ICD-10-CM

## 2024-01-29 DIAGNOSIS — D229 Melanocytic nevi, unspecified: Secondary | ICD-10-CM | POA: Diagnosis not present

## 2024-01-29 DIAGNOSIS — J449 Chronic obstructive pulmonary disease, unspecified: Secondary | ICD-10-CM | POA: Diagnosis not present

## 2024-01-29 DIAGNOSIS — M153 Secondary multiple arthritis: Secondary | ICD-10-CM

## 2024-01-29 DIAGNOSIS — I10 Essential (primary) hypertension: Secondary | ICD-10-CM

## 2024-01-29 DIAGNOSIS — E041 Nontoxic single thyroid nodule: Secondary | ICD-10-CM | POA: Insufficient documentation

## 2024-01-29 DIAGNOSIS — I700 Atherosclerosis of aorta: Secondary | ICD-10-CM | POA: Insufficient documentation

## 2024-01-29 DIAGNOSIS — H3530 Unspecified macular degeneration: Secondary | ICD-10-CM

## 2024-01-29 DIAGNOSIS — F418 Other specified anxiety disorders: Secondary | ICD-10-CM

## 2024-01-29 MED ORDER — VITAMIN D 25 MCG (1000 UNIT) PO TABS: 1000.0000 [IU] | ORAL_TABLET | Freq: Every day | ORAL | 1 refills | Status: AC

## 2024-01-29 NOTE — Telephone Encounter (Signed)
 Changed the visit type to MyChart visit per 5/27 staff message. Left the patients daughter a voicemail with the changes appointment details.

## 2024-01-29 NOTE — Patient Instructions (Signed)
 VISIT SUMMARY:  Today, you came in for a follow-up on your cognitive decline and recent diagnostic evaluations. You were accompanied by your daughter, who helps with your care. We discussed your memory issues, confusion with daily tasks, and the results of your recent tests, including a PET scan, MRI, and thyroid  biopsy. We also talked about your depression, vision problems, and recent urinary tract infection.  YOUR PLAN:  -MULTIPLE MYELOMA: Multiple myeloma is a type of blood cancer that affects plasma cells. You are currently undergoing evaluation and treatment, and we are waiting for the oncologist's management plan for the lesion on your foot. Please follow up with your oncologist for the scan results and the next steps.  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 Your MRI shows some brain volume loss. We recommend following up with a neurologist for further evaluation. We will also refer you to occupational therapy to help with daily tasks and medication management, and to a pharmacist for a medication review. We are considering remote monitoring to help you take your medications on time and exploring additional support resources.  -DEPRESSION: Depression is a mood disorder that causes persistent feelings of sadness and loss of interest. Your depression may be worsened by your cognitive decline and isolation. You are currently taking fluoxetine , which has helped somewhat. We will discuss with your neurologist about possibly adjusting your medication, including considering Rexulti for agitation and depression.  -AORTIC ATHEROSCLEROSIS: Aortic atherosclerosis is the buildup of calcium in the aorta, which can increase the risk of heart problems. We will monitor your cardiovascular risk factors and may adjust your medications to help prevent further plaque buildup.  -MACULAR DEGENERATION: Macular  degeneration is an eye condition that affects your central vision. Your left eye is particularly affected, which can impact your safety. We will monitor any changes in your vision and recommend following up with your retina specialist as needed.  -THYROID  NODULE BIOPSY: A thyroid  nodule is a growth in the thyroid  gland. Your recent biopsy suggests that the nodules are benign. We recommend monitoring the thyroid  nodules with a repeat evaluation in one year.  -URINARY TRACT INFECTION (UTI): A urinary tract infection is an infection in any part of the urinary system. You were recently treated with antibiotics. We will obtain a follow-up urinalysis to ensure the infection has cleared.  INSTRUCTIONS:  Please follow up with your oncologist for the scan results and management plan for the lesion on your foot. Also, follow up with a neurologist for further evaluation of your cognitive decline and to discuss potential medication adjustments for your depression. Obtain a follow-up urinalysis to ensure your recent urinary tract infection has cleared. Continue to monitor your vision and follow up with your retina specialist as needed. We will repeat the evaluation of your thyroid  nodules in one year.

## 2024-01-29 NOTE — Progress Notes (Signed)
 Subjective:  Patient ID: Stacey Melton, female    DOB: [birth year]  Age: 89 y.o. MRN: [id number]  Chief Complaint  Patient presents with   Medical Management of Chronic Issues    HPI:  Discussed the use of AI scribe software for clinical note transcription with the patient, who gave verbal consent to proceed.  History of Present Illness   Stacey Melton "Stacey Melton" is an 89 year old female who presents for follow-up on her cognitive decline and recent diagnostic evaluations. She is accompanied by her daughter, who is also her primary caregiver.  She has been experiencing significant cognitive decline, including memory issues and confusion with everyday tasks such as using a remote control, preparing food, and using a coffee pot. She feels 'confused all the time' and describes a sensation of being in a 'dungeon' despite knowing her location. Her daughter notes difficulty remembering actions she has taken, such as unplugging appliances, raising concerns about safety, particularly with food preparation. She is currently taking memantine  for cognitive symptoms, but her daughter assists with medication management due to her difficulty remembering to take medications consistently.  She has undergone extensive diagnostic evaluations, including a whole-body PET scan, an MRI of the brain, and an MRI of the abdomen. A thyroid  biopsy on Jan 23, 2024, involved two nodules; one was too small for sampling, while the other was successfully biopsied. The results are pending further review. She also has an appointment with an oncologist to discuss the results of these scans and the next steps for a lesion on her foot.  Aortic atherosclerosis was identified on her PET scan, showing calcium buildup in the aorta, a risk factor for cardiovascular events. She is also experiencing macular degeneration, particularly affecting her left eye, contributing to safety concerns due to limited vision.  She feels depressed,  attributing it to living alone and having limited social interaction. She is currently taking fluoxetine  (Prozac ) for depression, which has helped improve her mood, though significant symptoms persist.  She has a history of urinary tract infections (UTIs) and was recently treated for one. She feels shaky and weak at times, although she did not feel shaky on the day of the visit. A follow-up urinalysis is planned to ensure the infection has cleared.          01/29/2024    1:55 PM 10/09/2023    2:16 PM 10/30/2022    4:35 PM 03/29/2022   12:37 PM 10/11/2021    1:31 PM  Depression screen PHQ 2/9  Decreased Interest 2 3 3  0 0  Down, Depressed, Hopeless 3 3 3  0 0  PHQ - 2 Score 5 6 6  0 0  Altered sleeping 0 3 3  0  Tired, decreased energy 2 2 3   0  Change in appetite 1 0 0  0  Feeling bad or failure about yourself  0 0 2  0  Trouble concentrating 3 3 3   0  Moving slowly or fidgety/restless 3 0 0  0  Suicidal thoughts 0 1 0  0  PHQ-9 Score 14 15 17   0  Difficult doing work/chores  Somewhat difficult Somewhat difficult          01/29/2024    1:55 PM  Fall Risk   Falls in the past year? 0  Number falls in past yr: 0  Injury with Fall? 0  Risk for fall due to : No Fall Risks    Patient Care Team: Odilia Bennett, [place] as PCP - General (Physician  Assistant)   Review of Systems  Constitutional:  Negative for appetite change, fatigue and fever.  HENT:  Negative for congestion, ear pain, sinus pressure and sore throat.   Respiratory:  Negative for cough, chest tightness, shortness of breath and wheezing.   Cardiovascular:  Negative for chest pain and palpitations.  Gastrointestinal:  Negative for abdominal pain, constipation, diarrhea, nausea and vomiting.  Genitourinary:  Negative for dysuria and hematuria.  Musculoskeletal:  Negative for arthralgias, back pain, joint swelling and myalgias.  Skin:  Negative for rash.  Neurological:  Negative for dizziness, weakness and headaches.   Psychiatric/Behavioral:  Negative for dysphoric mood. The patient is not nervous/anxious.     Current Outpatient Medications on File Prior to Visit  Medication Sig Dispense Refill   cyanocobalamin  (VITAMIN B12) 1000 MCG tablet Take 1,000 mcg by mouth daily.     FLUoxetine  (PROZAC ) 20 MG tablet Take 1 tablet (20 mg total) by mouth daily. 90 tablet 3   memantine  (NAMENDA ) 10 MG tablet Take 1 tablet (10 mg at night) for 2 weeks, then increase to 1 tablet (10 mg) twice a day 60 tablet 11   No current facility-administered medications on file prior to visit.   Past Medical History:  Diagnosis Date   Allergy    COPD (chronic obstructive pulmonary disease) (HCC)    Cystocele    Depression    GERD (gastroesophageal reflux disease)    Hematuria    microscopic   Hemorrhoids    History of recurrent UTIs    Hypertension    Insomnia    Melanoma (HCC) 11/07/2023   Mid chest - needs Mohs   Mild cognitive impairment 2022   Osteoarthritis    Past Surgical History:  Procedure Laterality Date   ABDOMINAL HYSTERECTOMY     COLONOSCOPY  07/07/08   repeat in 3 yrs Dr. Randal Bury   CYSTOSCOPY  12/21/08   normal Dr. Enrigue Harvard    Family History  Problem Relation Age of Onset   Arthritis Other    Breast cancer Other    Colon cancer Other    Social History   Socioeconomic History   Marital status: Widowed    Spouse name: Not on file   Number of children: 5   Years of education: Not on file   Highest education level: 10th grade  Occupational History   Not on file  Tobacco Use   Smoking status: Never   Smokeless tobacco: Never  Substance and Sexual Activity   Alcohol use: Not Currently    Comment: rare   Drug use: No   Sexual activity: Not Currently  Other Topics Concern   Not on file  Social History Narrative   Right handed   Drinks caffeine   Lives alone   One floor home   Social Drivers of Health   Financial Resource Strain: Low Risk  (01/26/2024)   Overall Financial Resource  Strain (CARDIA)    Difficulty of Paying Living Expenses: Not hard at all  Food Insecurity: No Food Insecurity (01/26/2024)   Hunger Vital Sign    Worried About Running Out of Food in the Last Year: Never true    Ran Out of Food in the Last Year: Never true  Transportation Needs: No Transportation Needs (01/26/2024)   PRAPARE - Administrator, Civil Service (Medical): No    Lack of Transportation (Non-Medical): No  Physical Activity: Unknown (01/26/2024)   Exercise Vital Sign    Days of Exercise per Week: 0 days  Minutes of Exercise per Session: Not on file  Stress: Stress Concern Present (01/26/2024)   [employer] of Occupational Health - Occupational Stress Questionnaire    Feeling of Stress : Very much  Social Connections: Socially Isolated (01/26/2024)   Social Connection and Isolation Panel [NHANES]    Frequency of Communication with Friends and Family: More than three times a week    Frequency of Social Gatherings with Friends and Family: Once a week    Attends Religious Services: Never    Database administrator or Organizations: No    Attends Engineer, structural: Not on file    Marital Status: Widowed    Objective:  BP 112/60   Pulse 81   Temp 97.6 F (36.4 C)   Ht 5\' 5"  (1.651 m)   Wt 164 lb 3.2 oz (74.5 kg)   SpO2 96%   BMI 27.32 kg/m      01/29/2024    1:52 PM 12/23/2023   12:34 PM 12/09/2023    8:27 AM  BP/Weight  Systolic BP 112 157 157  Diastolic BP 60 96 70  Wt. (Lbs) 164.2 167.2   BMI 27.32 kg/m2 27.82 kg/m2     Physical Exam Vitals reviewed.  Constitutional:      Appearance: Normal appearance.  Neck:     Vascular: No carotid bruit.  Cardiovascular:     Rate and Rhythm: Normal rate and regular rhythm.     Heart sounds: Normal heart sounds.  Pulmonary:     Effort: Pulmonary effort is normal.     Breath sounds: Normal breath sounds.  Abdominal:     General: Bowel sounds are normal.     Palpations: Abdomen is soft.      Tenderness: There is no abdominal tenderness.  Skin:    General: Skin is warm.          Comments: Scar from previous [place] surgery  Neurological:     Mental Status: She is alert and oriented to person, place, and time.  Psychiatric:        Mood and Affect: Mood normal.        Behavior: Behavior normal.     Diabetic Foot Exam - Simple   No data filed      Lab Results  Component Value Date   WBC 7.7 12/06/2023   HGB 14.3 12/06/2023   HCT 44.8 12/06/2023   PLT 199 12/06/2023   GLUCOSE 134 (H) 12/06/2023   CHOL 217 (H) 10/09/2023   TRIG 63 10/09/2023   HDL 58 10/09/2023   LDLDIRECT 149.8 01/29/2012   LDLCALC 148 (H) 10/09/2023   ALT 18 12/06/2023   AST 18 12/06/2023   NA 140 12/06/2023   K 4.6 12/06/2023   CL 105 12/06/2023   CREATININE 0.91 12/06/2023   BUN 17 12/06/2023   CO2 27 12/06/2023   TSH 0.957 10/09/2023   HGBA1C 5.0 10/09/2023      Assessment & Plan:  Essential hypertension -     Comprehensive metabolic panel with GFR -     AMB Referral [facility] Care Management -     CBC with Differential/Platelet  Vascular dementia with agitation, unspecified dementia severity (HCC) Assessment & Plan: Significant cognitive decline with suspected Alzheimer's. MRI shows volume loss. Challenges with medication adherence and daily tasks. - Follow up with neurologist for MRI results and further evaluation. - Refer to occupational therapy for assistance with daily living activities and medication management. - Refer to pharmacist for medication review and potential  pill packet setup. - Consider remote therapeutic monitoring for medication adherence. - Explore [facility] Care management for additional support and resources.  Orders: -     Ambulatory referral to Home Health  COPD mixed type [facility]) Assessment & Plan: No current symptoms or use of inhaler. -Monitor for changes in breathlessness and consider prescription of inhaler if symptoms worsen.   Thyroid   nodule Assessment & Plan: Recent FNB inconclusive Was sent off for second option Continue to follow up on results   Atypical mole Assessment & Plan: Undergoing evaluation and treatment. Awaiting oncologist's management plan for foot lesion. - Follow up with oncologist for scan results and management plan for foot lesion.   Depression with anxiety Assessment & Plan: Depression likely exacerbated by cognitive decline and isolation. On fluoxetine  with some improvement. Considering Rexulti for agitation and depression. - Discuss with neurologist about potential medication adjustments, including consideration of Rexulti for agitation and depression.   Aortic atherosclerosis (HCC) Assessment & Plan: Mild calcium buildup identified, posing cardiovascular risk. - Monitor cardiovascular risk factors and consider medication adjustments to prevent plaque buildup.  Orders: -     Lipid panel -     Hemoglobin A1c  Memory impairment Assessment & Plan: Significant cognitive decline with suspected Alzheimer's. MRI shows volume loss. Challenges with medication adherence and daily tasks. - Follow up with neurologist for MRI results and further evaluation. - Refer to occupational therapy for assistance with daily living activities and medication management. - Refer to pharmacist for medication review and potential pill packet setup. - Consider remote therapeutic monitoring for medication adherence. - Explore [facility] Care management for additional support and resources.  Orders: -     AMB Referral [facility] Care Management -     POCT URINALYSIS DIP (CLINITEK) -     Ambulatory referral to Home Health  Advanced age-related macular degeneration Assessment & Plan: Limited vision in left eye affecting safety. Recent retina specialist visit noted. - Monitor vision changes and follow up with retina specialist as needed.   Other secondary osteoarthritis of multiple sites Assessment &  Plan: Controlled Denies any worsening or changing symptoms Continue to monitor for any worsening symptoms Will consider DEXA scan after getting these current issues managed.  Orders: -     Vitamin D ; Take 1 tablet (1,000 Units total) by mouth daily.  Dispense: 90 tablet; Refill: 1      Meds ordered this encounter  Medications   cholecalciferol (VITAMIN D3) 25 MCG (1000 UNIT) tablet    Sig: Take 1 tablet (1,000 Units total) by mouth daily.    Dispense:  90 tablet    Refill:  1    Orders Placed This Encounter  Procedures   Comprehensive metabolic panel with GFR   Lipid panel   CBC with Differential/Platelet   Hemoglobin A1c   AMB Referral [facility]I Care Management   Ambulatory referral to Home Health   POCT URINALYSIS DIP (CLINITEK)     Follow-up: No follow-ups on file.   I,Lauren M Auman,acting as a Neurosurgeon for [employer], [employer].,have documented all relevant documentation on the behalf of Odilia Bennett, PA,as directed by  Odilia Bennett, PA while in the presence of Odilia Bennett, [place].   An After Visit Summary was printed and given to the patient.  Odilia Bennett, [place] [facility] [phone]

## 2024-01-29 NOTE — Assessment & Plan Note (Signed)
 Significant cognitive decline with suspected Alzheimer's. MRI shows volume loss. Challenges with medication adherence and daily tasks. - Follow up with neurologist for MRI results and further evaluation. - Refer to occupational therapy for assistance with daily living activities and medication management. - Refer to pharmacist for medication review and potential pill packet setup. - Consider remote therapeutic monitoring for medication adherence. - Explore [facility] Care management for additional support and resources.

## 2024-01-29 NOTE — Assessment & Plan Note (Signed)
 Limited vision in left eye affecting safety. Recent retina specialist visit noted. - Monitor vision changes and follow up with retina specialist as needed.

## 2024-01-29 NOTE — Assessment & Plan Note (Signed)
 Mild calcium buildup identified, posing cardiovascular risk. - Monitor cardiovascular risk factors and consider medication adjustments to prevent plaque buildup.

## 2024-01-29 NOTE — Assessment & Plan Note (Signed)
 Undergoing evaluation and treatment. Awaiting oncologist's management plan for foot lesion. - Follow up with oncologist for scan results and management plan for foot lesion.

## 2024-01-29 NOTE — Assessment & Plan Note (Signed)
 Controlled Denies any worsening or changing symptoms Continue to monitor for any worsening symptoms Will consider DEXA scan after getting these current issues managed.

## 2024-01-29 NOTE — Assessment & Plan Note (Signed)
 Depression likely exacerbated by cognitive decline and isolation. On fluoxetine  with some improvement. Considering Rexulti for agitation and depression. - Discuss with neurologist about potential medication adjustments, including consideration of Rexulti for agitation and depression.

## 2024-01-29 NOTE — Assessment & Plan Note (Signed)
 Recent FNB inconclusive Was sent off for second option Continue to follow up on results

## 2024-01-29 NOTE — Assessment & Plan Note (Signed)
 No current symptoms or use of inhaler. -Monitor for changes in breathlessness and consider prescription of inhaler if symptoms worsen.

## 2024-01-30 ENCOUNTER — Inpatient Hospital Stay

## 2024-01-30 ENCOUNTER — Ambulatory Visit: Payer: Self-pay | Admitting: Physician Assistant

## 2024-01-30 DIAGNOSIS — C4371 Malignant melanoma of right lower limb, including hip: Secondary | ICD-10-CM | POA: Insufficient documentation

## 2024-01-30 DIAGNOSIS — C439 Malignant melanoma of skin, unspecified: Secondary | ICD-10-CM

## 2024-01-30 DIAGNOSIS — C4359 Malignant melanoma of other part of trunk: Secondary | ICD-10-CM | POA: Insufficient documentation

## 2024-01-30 LAB — COMPREHENSIVE METABOLIC PANEL WITH GFR
ALT: 14 IU/L (ref 0–32)
AST: 16 IU/L (ref 0–40)
Albumin: 4 g/dL (ref 3.7–4.7)
Alkaline Phosphatase: 58 IU/L (ref 44–121)
BUN/Creatinine Ratio: 17 (ref 12–28)
BUN: 17 mg/dL (ref 8–27)
Bilirubin Total: 0.6 mg/dL (ref 0.0–1.2)
CO2: 26 mmol/L (ref 20–29)
Calcium: 9.4 mg/dL (ref 8.7–10.3)
Chloride: 102 mmol/L (ref 96–106)
Creatinine, Ser: 1.03 mg/dL — ABNORMAL HIGH (ref 0.57–1.00)
Globulin, Total: 2.3 g/dL (ref 1.5–4.5)
Glucose: 136 mg/dL — ABNORMAL HIGH (ref 70–99)
Potassium: 5.1 mmol/L (ref 3.5–5.2)
Sodium: 141 mmol/L (ref 134–144)
Total Protein: 6.3 g/dL (ref 6.0–8.5)
eGFR: 52 mL/min/{1.73_m2} — ABNORMAL LOW (ref >59)

## 2024-01-30 LAB — LIPID PANEL
Chol/HDL Ratio: 4.3 ratio (ref 0.0–4.4)
Cholesterol, Total: 198 mg/dL (ref 100–199)
HDL: 46 mg/dL (ref >39)
LDL Chol Calc (NIH): 133 mg/dL — ABNORMAL HIGH (ref 0–99)
Triglycerides: 107 mg/dL (ref 0–149)
VLDL Cholesterol Cal: 19 mg/dL (ref 5–40)

## 2024-01-30 LAB — CBC WITH DIFFERENTIAL/PLATELET
Basophils Absolute: 0 10*3/uL (ref 0.0–0.2)
Basos: 0 %
EOS (ABSOLUTE): 0.1 10*3/uL (ref 0.0–0.4)
Eos: 2 %
Hematocrit: 44.8 % (ref 34.0–46.6)
Hemoglobin: 14.3 g/dL (ref 11.1–15.9)
Immature Grans (Abs): 0 10*3/uL (ref 0.0–0.1)
Immature Granulocytes: 0 %
Lymphocytes Absolute: 0.6 10*3/uL — ABNORMAL LOW (ref 0.7–3.1)
Lymphs: 11 %
MCH: 28.9 pg (ref 26.6–33.0)
MCHC: 31.9 g/dL (ref 31.5–35.7)
MCV: 91 fL (ref 79–97)
Monocytes Absolute: 0.4 10*3/uL (ref 0.1–0.9)
Monocytes: 7 %
Neutrophils Absolute: 4.1 10*3/uL (ref 1.4–7.0)
Neutrophils: 80 %
Platelets: 212 10*3/uL (ref 150–450)
RBC: 4.95 x10E6/uL (ref 3.77–5.28)
RDW: 12.9 % (ref 11.7–15.4)
WBC: 5.2 10*3/uL (ref 3.4–10.8)

## 2024-01-30 LAB — HEMOGLOBIN A1C
Est. average glucose Bld gHb Est-mCnc: 97 mg/dL
Hgb A1c MFr Bld: 5 % (ref 4.8–5.6)

## 2024-01-30 NOTE — Progress Notes (Unsigned)
 [facility] CONSULT NOTE  Patient Care Team: Odilia Bennett, [place] as PCP - General (Physician Assistant)  Type: video visit Patient location: home in [facility] Physician location: [facility]  ASSESSMENT & PLAN:  Stacey Melton is a 89 y.o.female with history of melanoma, GERD, HTN, memory loss, macular degeneration being seen at [facility] for invasive melanoma.  Patient has two locations of biopsy proven melanoma. One on the chest pT1a 0.3 mm. The other one over right middle plantar area with biopsy and showed malignant melanoma. At least pT3a with Breslow's depth of at least 2.3 mm.   Given her memory loss, daughter is her medical POA. 3 children are making joint decision together.  Clinically with more memory loss.   PET scan showed left adrenal uptake, right thyroid  nodule. MR abd showed most likely a benign adrenal adenoma but metastatic disease is not excluded and close attention on follow-up is warranted. FNA right thyroid  nodule showed a benign follicular nodule.   Discussed when there is no distant metastatic disease identify, typical may refer for WLE and SLNB. Given her age, and worsening memory loss, concerning about general anesthesia. She also tries to live alone and as independent as possible. Surgery and recovery can be challenge. Discussed immunotherapy can also result in significant side effects in minority of patients and not ideal in her scenario living alone as well. Potentially consultation with radiation if feasible. Continue surveillance afterwards for metastatic disease. Daughter expresses this is in the same line of her and other siblings.  Assessment & Plan Malignant melanoma of skin (HCC) Reached out to Dr. Fain Home. Consider re-excision for margin clearance. Continue long term follow up with dermatology and us .  Follow up in about 4 months.  All questions were answered. The patient knows to call the clinic with any problems, questions or  concerns. No barriers to learning was detected.  I spent a total of 30 minutes including review of chart and various tests results, face-to-face time with the patient, discussions about results, plan of care and coordination of care plan with other providers and staff members.   Lowanda Ruddy, MD 5/30/20257:01 AM  CHIEF COMPLAINTS/PURPOSE OF CONSULTATION:  melanoma  HISTORY OF PRESENTING ILLNESS:  Stacey Melton 89 y.o. female is with her daughter Astrid Lay at home because of melanoma.  Report she is low energy. Report living independently herself and able to perform basic daily functioning. Some diminished appetite. She walks without help. Memory is "horrible" per daughter.  Report of significant cognitive decline and cannot remember much short term.  Report she gets tired after recent imaging and multiple visits easily.  Oncology History:  11/07/23 Patient had skin biopsy on mid chest showed malignant melanoma 0.3 mm Breslow's depth, negative ulceration., pT1a.   On 12/09/23 she had right middle plantar skin biopsy and showed malignant melanoma. At least pT3a with Breslow's depth of at least 2.3 mm. +margin. No ulceration.  12/10/23 dermpath showed negative for residual melanoma at mid chest.  MEDICAL HISTORY:  Past Medical History:  Diagnosis Date   Allergy    COPD (chronic obstructive pulmonary disease) (HCC)    Cystocele    Depression    GERD (gastroesophageal reflux disease)    Hematuria    microscopic   Hemorrhoids    History of recurrent UTIs    Hypertension    Insomnia    Melanoma (HCC) 11/07/2023   Mid chest - needs Mohs   Mild cognitive impairment 2022   Osteoarthritis  SURGICAL HISTORY: Past Surgical History:  Procedure Laterality Date   ABDOMINAL HYSTERECTOMY     COLONOSCOPY  07/07/08   repeat in 3 yrs Dr. Randal Bury   CYSTOSCOPY  12/21/08   normal Dr. Enrigue Harvard    SOCIAL HISTORY: Social History   Socioeconomic History   Marital status: Widowed     Spouse name: Not on file   Number of children: 5   Years of education: Not on file   Highest education level: 10th grade  Occupational History   Not on file  Tobacco Use   Smoking status: Never   Smokeless tobacco: Never  Substance and Sexual Activity   Alcohol use: Not Currently    Comment: rare   Drug use: No   Sexual activity: Not Currently  Other Topics Concern   Not on file  Social History Narrative   Right handed   Drinks caffeine   Lives alone   One floor home   Social Drivers of Health   Financial Resource Strain: Low Risk  (01/26/2024)   Overall Financial Resource Strain (CARDIA)    Difficulty of Paying Living Expenses: Not hard at all  Food Insecurity: No Food Insecurity (01/26/2024)   Hunger Vital Sign    Worried About Running Out of Food in the Last Year: Never true    Ran Out of Food in the Last Year: Never true  Transportation Needs: No Transportation Needs (01/26/2024)   PRAPARE - Administrator, Civil Service (Medical): No    Lack of Transportation (Non-Medical): No  Physical Activity: Unknown (01/26/2024)   Exercise Vital Sign    Days of Exercise per Week: 0 days    Minutes of Exercise per Session: Not on file  Stress: Stress Concern Present (01/26/2024)   [employer] of Occupational Health - Occupational Stress Questionnaire    Feeling of Stress : Very much  Social Connections: Socially Isolated (01/26/2024)   Social Connection and Isolation Panel [NHANES]    Frequency of Communication with Friends and Family: More than three times a week    Frequency of Social Gatherings with Friends and Family: Once a week    Attends Religious Services: Never    Database administrator or Organizations: No    Attends Engineer, structural: Not on file    Marital Status: Widowed  Intimate Partner Violence: Not At Risk (03/29/2022)   Humiliation, Afraid, Rape, and Kick questionnaire    Fear of Current or Ex-Partner: No    Emotionally Abused: No     Physically Abused: No    Sexually Abused: No    FAMILY HISTORY: Family History  Problem Relation Age of Onset   Arthritis Other    Breast cancer Other    Colon cancer Other     ALLERGIES:  has no known allergies.  MEDICATIONS:  Current Outpatient Medications  Medication Sig Dispense Refill   cholecalciferol (VITAMIN D3) 25 MCG (1000 UNIT) tablet Take 1 tablet (1,000 Units total) by mouth daily. 90 tablet 1   cyanocobalamin  (VITAMIN B12) 1000 MCG tablet Take 1,000 mcg by mouth daily.     FLUoxetine  (PROZAC ) 20 MG tablet Take 1 tablet (20 mg total) by mouth daily. 90 tablet 3   memantine  (NAMENDA ) 10 MG tablet Take 1 tablet (10 mg at night) for 2 weeks, then increase to 1 tablet (10 mg) twice a day 60 tablet 11   No current facility-administered medications for this visit.    REVIEW OF SYSTEMS:   All relevant systems  were reviewed with the patient and are negative.  PHYSICAL EXAMINATION: ECOG PERFORMANCE STATUS: 2 - Symptomatic, <50% confined to bed  Video visit. No distress and daughter next to her.   LABORATORY DATA:  I have reviewed the data as listed Lab Results  Component Value Date   WBC 5.2 01/29/2024   HGB 14.3 01/29/2024   HCT 44.8 01/29/2024   MCV 91 01/29/2024   PLT 212 01/29/2024   Recent Labs    10/09/23 1553 12/06/23 1931 01/29/24 1449  NA 147* 140 141  K 5.2 4.6 5.1  CL 106 105 102  CO2 26 27 26   GLUCOSE 85 134* 136*  BUN 20 17 17   CREATININE 0.78 0.91 1.03*  CALCIUM 9.8 9.4 9.4  GFRNONAA  --  >60  --   PROT 6.7 6.6 6.3  ALBUMIN 4.4 4.0 4.0  AST 20 18 16   ALT 14 18 14   ALKPHOS 58 37* 58  BILITOT 0.6 0.8 0.6    RADIOGRAPHIC STUDIES: I have personally reviewed the radiological images as listed and agreed with the findings in the report. US  FNA BX THYROID  1ST LESION AFIRMA Result Date: 01/23/2024 INDICATION: Patient with history of melanoma, incidentally found to have FDG avid right thyroid  nodule on recent PET. Follow up US  thyroid   noted right mid and left inferior thyroid  nodules which meet criteria for fine needle aspiration. EXAM: ULTRASOUND GUIDED FINE NEEDLE ASPIRATION OF INDETERMINATE THYROID  NODULE x 2 COMPARISON:  US  Thyroid  01/10/24 MEDICATIONS: 5 mL 1% lidocaine COMPLICATIONS: None immediate. TECHNIQUE: Informed written consent was obtained from the patient after a discussion of the risks, benefits and alternatives to treatment. Questions regarding the procedure were encouraged and answered. A timeout was performed prior to the initiation of the procedure. Pre-procedural ultrasound scanning demonstrated unchanged size and appearance of the indeterminate nodules within the right mid and left inferior thyroid  lobes. The procedure was planned. The neck was prepped in the usual sterile fashion, and a sterile drape was applied covering the operative field. A timeout was performed prior to the initiation of the procedure. Local anesthesia was provided with 1% lidocaine. Under direct ultrasound guidance, 5 FNA biopsies were performed of the indeterminate right mid thyroid  nodule with a 25 gauge needle. Multiple ultrasound images were saved for procedural documentation purposes. The samples were prepared and submitted to pathology. Under direct ultrasound guidance, multiple attempts were made to biopsy the indeterminate left inferior thyroid  nodule with a 25 gauge needle however given it's small size as well as very inferior and posterior position the nodule was not able to be accessed. No specimen was collected. Limited post procedural scanning was negative for hematoma or additional complication. Dressings were placed. The patient tolerated the above procedures procedure well without immediate postprocedural complication. FINDINGS: Nodule reference number based on prior diagnostic ultrasound: 1 Maximum size: 1.5 cm Location: Right; Mid ACR TI-RADS risk category: TR4 (4-6 points) Reason for biopsy: meets ACR TI-RADS criteria. FDG avid on PET  scan 12/26/23 _________________________________________________________ Nodule reference number based on prior diagnostic ultrasound: 2 Maximum size: 1.0 cm Location: Left; Inferior ACR TI-RADS risk category: TR5 (>/= 7 points) Reason for biopsy: meets ACR TI-RADS criteria -- no specimen was obtained from this nodule IMPRESSION: 1. Technically successful ultrasound guided fine needle aspiration of right mid thyroid  nodule 2. Unsuccessful ultrasound guided fine needle aspiration of left inferior thyroid  nodule. Recommend monitoring with 1 year follow up ultrasound. Performed by Nathan Bake, PA-C Electronically Signed   By: Georganne Kind.D.  On: 01/23/2024 15:59   US  THYROID  Result Date: 01/18/2024 CLINICAL DATA:  Incidental on PET. avid nodule posteriorly in the right thyroid  lobe needs to rule out metastasis EXAM: THYROID  ULTRASOUND TECHNIQUE: Ultrasound examination of the thyroid  gland and adjacent soft tissues was performed. COMPARISON:  PET-CT, 12/26/2023 FINDINGS: Parenchymal Echotexture: Mildly heterogenous Isthmus: 0.4 cm Right lobe: 4.5 x 2.3 x 1.9 cm Left lobe: 3.4 x 1.2 x 1.4 cm _________________________________________________________ Estimated total number of nodules >/= 1 cm: 2 Number of spongiform nodules >/=  2 cm not described below (TR1): 0 Number of mixed cystic and solid nodules >/= 1.5 cm not described below (TR2): 0 _________________________________________________________ Nodule # 1: Location: RIGHT; Mid Maximum size: 1.5 cm; Other 2 dimensions: 1.3 x 1.1 cm Composition: solid/almost completely solid (2) Echogenicity: isoechoic (1) Shape: taller-than-wide (3) Margins: ill-defined (0) Echogenic foci: none (0) ACR TI-RADS total points: 6. ACR TI-RADS risk category: TR4 (4-6 points). ACR TI-RADS recommendations: **Given size (>/= 1.5 cm) and appearance, fine needle aspiration of this moderately suspicious nodule should be considered based on TI-RADS criteria.  _________________________________________________________ Nodule # 1: Location: LEFT; Inferior Maximum size: 1.0 cm; Other 2 dimensions: 1.0 x 0.9 cm Composition: solid/almost completely solid (2) Echogenicity: hypoechoic (2) Shape: not taller-than-wide (0) Margins: ill-defined (0) Echogenic foci: punctate echogenic foci (3) ACR TI-RADS total points: 7. ACR TI-RADS risk category: TR5 (>/= 7 points). ACR TI-RADS recommendations: **Given size (>/= 1.0 cm) and appearance, fine needle aspiration of this highly suspicious nodule should be considered based on TI-RADS criteria. _________________________________________________________ Additional sub-1 cm solid and cystic nodules scattered within the gland do not meet threshold for follow-up nor biopsy per current criteria. No cervical adenopathy or abnormal fluid collection within the imaged neck. IMPRESSION: 1.5 cm RIGHT mid TR-4 and 1.0 cm LEFT inferior TR-5 thyroid  nodules. FNA biopsies of these suspicious nodules should be considered based on TI-RADS criteria. The above is in keeping with the ACR TI-RADS recommendations - J Am Coll Radiol 2017;14:587-595. Electronically Signed   By: Art Largo M.D.   On: 01/18/2024 12:24   MR Abdomen W Wo Contrast Result Date: 01/10/2024 CLINICAL DATA:  Melanoma, left adrenal nodule with abnormal FDG avidity EXAM: MRI ABDOMEN WITHOUT AND WITH CONTRAST TECHNIQUE: Multiplanar multisequence MR imaging of the abdomen was performed both before and after the administration of intravenous contrast. CONTRAST:  7mL GADAVIST  GADOBUTROL  1 MMOL/ML IV SOLN COMPARISON:  PET-CT, 12/26/2023 FINDINGS: Lower chest: No acute abnormality.  Small hiatal hernia. Hepatobiliary: No solid liver abnormality is seen. Mildly coarse contour of the liver. Small benign hemangioma of the inferior right lobe of the liver abutting the gallbladder fossa (series 4, image 22). Mildly distended gallbladder. Large gallstone. No gallbladder wall thickening, or biliary  dilatation. Pancreas: Unremarkable. No pancreatic ductal dilatation or surrounding inflammatory changes. Spleen: Normal in size without significant abnormality. Adrenals/Urinary Tract: Left adrenal nodule measures 1.7 x 1.6 cm (series 4, image 16), and demonstrates significant internal macroscopic fat content on in and opposed phase imaging, although does not demonstrate the profound degree of lipid content typical of adrenal adenomata (series 5, image 21). Normal right adrenal. Numerous large bilateral parapelvic renal cysts, benign, requiring no specific further follow-up or characterization. No obvious calculi. No mass. No hydronephrosis. Stomach/Bowel: Stomach is within normal limits. No evidence of bowel wall thickening, distention, or inflammatory changes. Vascular/Lymphatic: Varices in the left upper quadrant. Aortic atherosclerosis. No enlarged abdominal lymph nodes. Other: No abdominal wall hernia or abnormality. No ascites. Musculoskeletal: No acute or significant osseous findings.  IMPRESSION: 1. Left adrenal nodule measures 1.7 x 1.6 cm, and demonstrates significant internal macroscopic fat content on in and opposed phase imaging, although does not demonstrate the profound degree of lipid content typical of adrenal adenomata. In the absence of other evidence of metastatic disease on PET-CT, this is most likely a benign adrenal adenoma with modest lipid content, which sometimes demonstrate low-level FDG avidity. However, metastatic disease (including a collision metastasis involving an incidentally present adenoma) is not excluded and close attention on follow-up is warranted. 2. No other evidence of lymphadenopathy or metastatic disease in the abdomen. 3. Coarse contour of the liver and left upper quadrant varices highly suspicious for cirrhosis and portal hypertension. 4. Cholelithiasis. Aortic Atherosclerosis (ICD10-I70.0). Electronically Signed   By: Fredricka Jenny M.D.   On: 01/10/2024 20:52

## 2024-01-30 NOTE — Assessment & Plan Note (Signed)
 Reached out to Dr. Paci. Consider re-excision for margin clearance. Continue long term follow up with dermatology and us .

## 2024-02-03 ENCOUNTER — Telehealth: Payer: Self-pay

## 2024-02-03 NOTE — Progress Notes (Signed)
 Complex Care Management Note  Care Guide Note 02/03/2024 Name: Kameren Baade MRN: [id number] DOB: 10-15-1932  Raeann Offner is a 89 y.o. year old female who sees Melody Hill, [place], [place] for primary care. I reached out to [employer] by phone today to offer complex care management services.  Ms. Averitt was given information about Complex Care Management services today including:   The Complex Care Management services include support from the care team which includes your Nurse Care Manager, Clinical Social Worker, or Pharmacist.  The Complex Care Management team is here to help remove barriers to the health concerns and goals most important to you. Complex Care Management services are voluntary, and the patient may decline or stop services at any time by request to their care team member.   Complex Care Management Consent Status: Patient agreed to services and verbal consent obtained.   Follow up plan:  Telephone appointment with complex care management team member scheduled for:  02/10/24 & 02/13/24.  Encounter Outcome:  Patient Scheduled  [facility]  [facility], [facility] Care Management Assistant Direct Dial: [phone]  Fax: [phone]

## 2024-02-04 ENCOUNTER — Encounter: Payer: Self-pay | Admitting: Physician Assistant

## 2024-02-04 ENCOUNTER — Ambulatory Visit: Admitting: Physician Assistant

## 2024-02-04 VITALS — BP 166/77 | HR 52 | Resp 20 | Ht 65.0 in | Wt 165.0 lb

## 2024-02-04 DIAGNOSIS — G309 Alzheimer's disease, unspecified: Secondary | ICD-10-CM

## 2024-02-04 DIAGNOSIS — F0280 Dementia in other diseases classified elsewhere without behavioral disturbance: Secondary | ICD-10-CM

## 2024-02-04 NOTE — Progress Notes (Signed)
 Assessment/Plan:   Dementia likely due to Alzheimer's disease  Stacey Melton is a very pleasant 89 y.o. RH female with a history of hypertension, hyperlipidemia, arthritis, anxiety, depression, COPD, melanoma right  foot, thyroid  nodule, bradycardia seen today in follow up to discuss the MRI of the brain results performed on  01/2024. These were remarkable for chronic microvascular ischemia, old microhemorrhage in the right occipital lobe, symmetrical ventricles, patent basilar cisterns, mild cerebral volume loss.  Hippocampal and cortical atrophy is noted.  No acute findings were seen.  Findings are concerning for dementia due to Alzheimer's disease.  Patient is accompanied in the office by her daughter. Patient was first seen on 12/04/2023 at which time MoCA or MMSE were unable to be performed as she was having significant difficulties with the test.  She is currently on memantine  10 mg twice daily, tolerating well.  Given the above findings, we discussed goals of care.  Patient is working with her Child psychotherapist for resources for assisted living, agree with the plan as the patient will need more monitoring for safety, cognitive and social stimulation.      Follow up in 6  months. Continue memantine  10 mg twice daily, side effects discussed Replenish B12, vitamin D  Recommend good control of cardiovascular risk factors Continue to control mood as per PCP, she is on Prozac  Agree with Assisted Living plans, patient will need 24/7 monitoring at some point, and she lives alone. Recommend increasing socialization   Initial visit 12-04-23 How long did patient have memory difficulties?  For the last 8 years, for the last 1 year. Patient has difficulty remembering new information, recent conversations and names of people, phone calls, meals. Used to do brain stimulating exercises, but not recently. repeats oneself?  Endorsed, quite frequently including during this visit Disoriented when walking into a  room?  Denies except occasionally not remembering what patient came to the room for   Leaving objects in unusual places?   Denies.  Wandering behavior? Denies.   Any personality changes, or depression, anxiety?  He has a history of anxiety and depression since the death of her husband in 03-02-07 Hallucinations or paranoia?  Denies.   Seizures? Denies.    Any sleep changes?  Sleeps well, in the past had nightmares but not recently.  Denies REM behavior or sleepwalking.   Sleep apnea? Denies.   Any hygiene concerns?  Denies.   Independent of bathing and dressing?  Endorsed  Who is in charge of the medications? Patient is in charge, daughter fills the pillbox, she may forget to take them.    Who is in charge of the finances? Daughter  is in charge     Any changes in appetite?   Not eating healthy foods. Does not drink plenty water    Patient have trouble swallowing?  Denies.   Does the patient cook? Yes, denies any issues     Any history of headaches?  Denies.   Chronic back pain?  Denies at this time. In the past she  had some arthritic pain but "she does not complain about it at this point "-her daughter said Ambulates with difficulty? Yes, walks without any walker or cane.  She only walk around the house or around the yard  Recent falls or head injuries? As a child she fell on the pavement with LOC.  No recent falls     Vision changes? Denies. Has advanced macular degeneration L eye decreased vision.  She has an appointment in the  near future with ophthalmology Stroke like symptoms?  Denies.   Any tremors?  Denies.   Any anosmia?  Over the last 6 months  Any incontinence of urine? Denies.   Any bowel dysfunction? Denies.      Patient lives alone with daughter supervision History of heavy alcohol intake? Denies.   History of heavy tobacco use? Denies.   Family history of dementia?   Grandmother had dementia, possible AD Does patient drive? yes, short distances.  denies any issues, but her  ophthalmologist recommended no further driving     CURRENT MEDICATIONS:  Outpatient Encounter Medications as of 02/04/2024  Medication Sig   cholecalciferol (VITAMIN D3) 25 MCG (1000 UNIT) tablet Take 1 tablet (1,000 Units total) by mouth daily.   cyanocobalamin  (VITAMIN B12) 1000 MCG tablet Take 1,000 mcg by mouth daily.   FLUoxetine  (PROZAC ) 20 MG tablet Take 1 tablet (20 mg total) by mouth daily.   memantine  (NAMENDA ) 10 MG tablet Take 1 tablet (10 mg at night) for 2 weeks, then increase to 1 tablet (10 mg) twice a day   No facility-administered encounter medications on file as of 02/04/2024.        No data to display             No data to display         Thank you for allowing us  the opportunity to participate in the care of this nice patient. Please do not hesitate to contact us  for any questions or concerns.   Total time spent on today's visit was 48 minutes dedicated to this patient today, preparing to see patient, examining the patient, ordering tests and/or medications and counseling the patient, documenting clinical information in the EHR or other health record, independently interpreting results and communicating results to the patient/family, discussing treatment and goals, answering patient's questions and coordinating care.  Cc:  Odilia Bennett, PA  Tex Filbert 02/04/2024 6:03 AM

## 2024-02-04 NOTE — Patient Instructions (Addendum)
 It was a pleasure to see you today at our office.   Recommendations:   Replenish B12  1000 micrograms daily , Vit D   Continue memantine  10 mg twice a day   Follow up  Dec 24 at 11:30   Recommend visiting the website : " Dementia Success Path" to better understand some behaviors related to memory loss.  For psychiatric meds, mood meds: Please have your primary care physician manage these medications.  If you have any severe symptoms of a stroke, or other severe issues such as confusion,severe chills or fever, etc call 911 or go to the ER as you may need to be evaluated further  Consider [facility]  [address], [place] [address] [phone]  Hours of Operation Mondays to Thursdays: 8 am to 8 pm,Fridays: 9 am to 8 pm, Saturdays: 9 am to 1 pm Sundays: Closed  [email or web address]/departments/parks-recreation/active-adults-50/[facility]    For assessment of decision of mental capacity and competency:  Call Dr. Laverne Potter, geriatric psychiatrist at [phone]   Whom to call: Memory  decline, memory medications: Call our office [phone]    [email or web address].org/resource/neuro-rehabilitation-apps-and-games/   RECOMMENDATIONS FOR ALL PATIENTS WITH MEMORY PROBLEMS: 1. Continue to exercise (Recommend 30 minutes of walking everyday, or 3 hours every week) 2. Increase social interactions - continue going to [place] and enjoy social gatherings with friends and family 3. Eat healthy, avoid fried foods and eat more fruits and vegetables 4. Maintain adequate blood pressure, blood sugar, and blood cholesterol level. Reducing the risk of stroke and cardiovascular disease also helps promoting better memory. 5. Avoid stressful situations. Live a simple life and avoid aggravations. Organize your time and prepare for the next day in anticipation. 6. Sleep well, avoid any interruptions of sleep and avoid any distractions in the bedroom that  may interfere with adequate sleep quality 7. Avoid sugar, avoid sweets as there is a strong link between excessive sugar intake, diabetes, and cognitive impairment We discussed the Mediterranean diet, which has been shown to help patients reduce the risk of progressive memory disorders and reduces cardiovascular risk. This includes eating fish, eat fruits and green leafy vegetables, nuts like almonds and hazelnuts, walnuts, and also use olive oil. Avoid fast foods and fried foods as much as possible. Avoid sweets and sugar as sugar use has been linked to worsening of memory function.  There is always a concern of gradual progression of memory problems. If this is the case, then we may need to adjust level of care according to patient needs. Support, both to the patient and caregiver, should then be put into place.       FALL PRECAUTIONS: Be cautious when walking. Scan the area for obstacles that may increase the risk of trips and falls. When getting up in the mornings, sit up at the edge of the bed for a few minutes before getting out of bed. Consider elevating the bed at the head end to avoid drop of blood pressure when getting up. Walk always in a well-lit room (use night lights in the walls). Avoid area rugs or power cords from appliances in the middle of the walkways. Use a walker or a cane if necessary and consider physical therapy for balance exercise. Get your eyesight checked regularly.  FINANCIAL OVERSIGHT: Supervision, especially oversight when making financial decisions or transactions is also recommended.  HOME SAFETY: Consider the safety of the kitchen when operating appliances like stoves, microwave oven, and blender. Consider having supervision and share cooking responsibilities  until no longer able to participate in those. Accidents with firearms and other hazards in the house should be identified and addressed as well.   ABILITY TO BE LEFT ALONE: If patient is unable to contact 911  operator, consider using LifeLine, or when the need is there, arrange for someone to stay with patients. Smoking is a fire hazard, consider supervision or cessation. Risk of wandering should be assessed by caregiver and if detected at any point, supervision and safe proof recommendations should be instituted.  MEDICATION SUPERVISION: Inability to self-administer medication needs to be constantly addressed. Implement a mechanism to ensure safe administration of the medications.      Mediterranean Diet A Mediterranean diet refers to food and lifestyle choices that are based on the traditions of countries located on the [employer]. This way of eating has been shown to help prevent certain conditions and improve outcomes for people who have chronic diseases, like kidney disease and heart disease. What are tips for following this plan? Lifestyle  Cook and eat meals together with your family, when possible. Drink enough fluid to keep your urine clear or pale yellow. Be physically active every day. This includes: Aerobic exercise like running or swimming. Leisure activities like gardening, walking, or housework. Get 7-8 hours of sleep each night. If recommended by your health care provider, drink red wine in moderation. This means 1 glass a day for nonpregnant women and 2 glasses a day for men. A glass of wine equals 5 oz (150 mL). Reading food labels  Check the serving size of packaged foods. For foods such as rice and pasta, the serving size refers to the amount of cooked product, not dry. Check the total fat in packaged foods. Avoid foods that have saturated fat or trans fats. Check the ingredients list for added sugars, such as corn syrup. Shopping  At the grocery store, buy most of your food from the areas near the walls of the store. This includes: Fresh fruits and vegetables (produce). Grains, beans, nuts, and seeds. Some of these may be available in unpackaged forms or large amounts  (in bulk). Fresh seafood. Poultry and eggs. Low-fat dairy products. Buy whole ingredients instead of prepackaged foods. Buy fresh fruits and vegetables in-season from local farmers markets. Buy frozen fruits and vegetables in resealable bags. If you do not have access to quality fresh seafood, buy precooked frozen shrimp or canned fish, such as tuna, salmon, or sardines. Buy small amounts of raw or cooked vegetables, salads, or olives from the deli or salad bar at your store. Stock your pantry so you always have certain foods on hand, such as olive oil, canned tuna, canned tomatoes, rice, pasta, and beans. Cooking  Cook foods with extra-virgin olive oil instead of using butter or other vegetable oils. Have meat as a side dish, and have vegetables or grains as your main dish. This means having meat in small portions or adding small amounts of meat to foods like pasta or stew. Use beans or vegetables instead of meat in common dishes like chili or lasagna. Experiment with different cooking methods. Try roasting or broiling vegetables instead of steaming or sauteing them. Add frozen vegetables to soups, stews, pasta, or rice. Add nuts or seeds for added healthy fat at each meal. You can add these to yogurt, salads, or vegetable dishes. Marinate fish or vegetables using olive oil, lemon juice, garlic, and fresh herbs. Meal planning  Plan to eat 1 vegetarian meal one day each week. Try to  work up to 2 vegetarian meals, if possible. Eat seafood 2 or more times a week. Have healthy snacks readily available, such as: Vegetable sticks with hummus. Greek yogurt. Fruit and nut trail mix. Eat balanced meals throughout the week. This includes: Fruit: 2-3 servings a day Vegetables: 4-5 servings a day Low-fat dairy: 2 servings a day Fish, poultry, or lean meat: 1 serving a day Beans and legumes: 2 or more servings a week Nuts and seeds: 1-2 servings a day Whole grains: 6-8 servings a  day Extra-virgin olive oil: 3-4 servings a day Limit red meat and sweets to only a few servings a month What are my food choices? Mediterranean diet Recommended Grains: Whole-grain pasta. Brown rice. Bulgar wheat. Polenta. Couscous. Whole-wheat bread. Dwyane Glad. Vegetables: Artichokes. Beets. Broccoli. Cabbage. Carrots. Eggplant. Green beans. Chard. Kale. Spinach. Onions. Leeks. Peas. Squash. Tomatoes. Peppers. Radishes. Fruits: Apples. Apricots. Avocado. Berries. Bananas. Cherries. Dates. Figs. Grapes. Lemons. Melon. Oranges. Peaches. Plums. Pomegranate. Meats and other protein foods: Beans. Almonds. Sunflower seeds. Pine nuts. Peanuts. Cod. Salmon. Scallops. Shrimp. Tuna. Tilapia. Clams. Oysters. Eggs. Dairy: Low-fat milk. Cheese. Greek yogurt. Beverages: Water. Red wine. Herbal tea. Fats and oils: Extra virgin olive oil. Avocado oil. Grape seed oil. Sweets and desserts: [place] yogurt with honey. Baked apples. Poached pears. Trail mix. Seasoning and other foods: Basil. Cilantro. Coriander. Cumin. Mint. Parsley. Sage. Rosemary. Tarragon. Garlic. Oregano. Thyme. Pepper. Balsalmic vinegar. Tahini. Hummus. Tomato sauce. Olives. Mushrooms. Limit these Grains: Prepackaged pasta or rice dishes. Prepackaged cereal with added sugar. Vegetables: Deep fried potatoes (french fries). Fruits: Fruit canned in syrup. Meats and other protein foods: Beef. Pork. Lamb. Poultry with skin. Hot dogs. Helene Loader. Dairy: Ice cream. Sour cream. Whole milk. Beverages: Juice. Sugar-sweetened soft drinks. Beer. Liquor and spirits. Fats and oils: Butter. Canola oil. Vegetable oil. Beef fat (tallow). Lard. Sweets and desserts: Cookies. Cakes. Pies. Candy. Seasoning and other foods: Mayonnaise. Premade sauces and marinades. The items listed may not be a complete list. Talk with your dietitian about what dietary choices are right for you. Summary The Mediterranean diet includes both food and lifestyle choices. Eat a  variety of fresh fruits and vegetables, beans, nuts, seeds, and whole grains. Limit the amount of red meat and sweets that you eat. Talk with your health care provider about whether it is safe for you to drink red wine in moderation. This means 1 glass a day for nonpregnant women and 2 glasses a day for men. A glass of wine equals 5 oz (150 mL). This information is not intended to replace advice given to you by your health care provider. Make sure you discuss any questions you have with your health care provider. Document Released: 04/12/2016 Document Revised: 05/15/2016 Document Reviewed: 04/12/2016 Elsevier Interactive Patient Education  2017 [employer].

## 2024-02-05 ENCOUNTER — Encounter: Payer: Self-pay | Admitting: Dermatology

## 2024-02-10 ENCOUNTER — Encounter: Payer: Self-pay | Admitting: Physician Assistant

## 2024-02-10 ENCOUNTER — Encounter: Admitting: Dermatology

## 2024-02-10 ENCOUNTER — Other Ambulatory Visit: Payer: Self-pay | Admitting: Licensed Clinical Social Worker

## 2024-02-10 NOTE — Patient Outreach (Signed)
 Complex Care Management   Visit Note  02/10/2024  Name:  Stacey Melton MRN: [id number] DOB: 05-Feb-1933  Situation: Referral received for Complex Care Management related to Dementia I obtained verbal consent from Caregiver.  Visit completed with P Ritchie   on the phone  Background:   Past Medical History:  Diagnosis Date   Allergy    COPD (chronic obstructive pulmonary disease) (HCC)    Cystocele    Depression    GERD (gastroesophageal reflux disease)    Hematuria    microscopic   Hemorrhoids    History of recurrent UTIs    Hypertension    Insomnia    Melanoma (HCC) 11/07/2023   Mid chest - needs Mohs   Mild cognitive impairment 2022   Osteoarthritis     Assessment: Patient Reported Symptoms:  Cognitive Cognitive Status: Unable to Assess Cognitive/Intellectual Conditions Management [RPT]: Other Other: demetia      Neurological Neurological Review of Symptoms: Not assessed    HEENT HEENT Symptoms Reported: Not assessed      Cardiovascular Cardiovascular Symptoms Reported: Not assessed    Respiratory Respiratory Symptoms Reported: Not assesed    Endocrine Patient reports the following symptoms related to hypoglycemia or hyperglycemia : Not assessed    Gastrointestinal Gastrointestinal Symptoms Reported: No symptoms reported      Genitourinary Genitourinary Symptoms Reported: Not assessed    Integumentary Integumentary Symptoms Reported: Not assessed    Musculoskeletal Musculoskelatal Symptoms Reviewed: Not assessed   Falls in the past year?: No Number of falls in past year: 1 or less Was there an injury with Fall?: No Fall Risk Category Calculator: 0 Patient Fall Risk Level: Low Fall Risk Patient at Risk for Falls Due to: No Fall Risks  Psychosocial       Do you feel physically threatened by others?: No      01/29/2024    1:55 PM  Depression screen PHQ 2/9  Decreased Interest 2  Down, Depressed, Hopeless 3  PHQ - 2 Score 5  Altered sleeping 0   Tired, decreased energy 2  Change in appetite 1  Feeling bad or failure about yourself  0  Trouble concentrating 3  Moving slowly or fidgety/restless 3  Suicidal thoughts 0  PHQ-9 Score 14    There were no vitals filed for this visit.  Medications Reviewed Today     Reviewed by Fletcher Humble, LCSW (Social Worker) on 02/10/24 at 1508  Med List Status: <None>   Medication Order Taking? Sig Documenting Provider Last Dose Status Informant  cholecalciferol (VITAMIN D3) 25 MCG (1000 UNIT) tablet [id number]  Take 1 tablet (1,000 Units total) by mouth daily. Odilia Bennett, PA  Active   cyanocobalamin  (VITAMIN B12) 1000 MCG tablet [id number] No Take 1,000 mcg by mouth daily. Wertman, Sara E, PA-C Taking Active Self  FLUoxetine  (PROZAC ) 20 MG tablet [id number] No Take 1 tablet (20 mg total) by mouth daily. Odilia Bennett, PA Taking Active   memantine  (NAMENDA ) 10 MG tablet [id number] No Take 1 tablet (10 mg at night) for 2 weeks, then increase to 1 tablet (10 mg) twice a day Rosi Converse, PA-C Taking Active             Recommendation:   Pt daughter and LCSW A Stacey Melton explored available options to assist with caregiving needs for pt Stacey Melton. Pt daughter will contact [facility] regarding enhanced benefits such as food, pca assistance. [name] is aware pca services is generally not a covered expense with medicare. Pt daughter will  explore pca services and discuss care plannning with family for additional support.   Follow Up Plan:   Telephone follow up appointment date/time:  02/24/2024   Fletcher Humble MSW, LCSW Licensed Clinical Social Worker  [facility], Population Health Direct Dial: [phone]  Fax: [phone]

## 2024-02-10 NOTE — Patient Instructions (Signed)
 Visit Information  Thank you for taking time to visit with me today. Please don't hesitate to contact me if I can be of assistance to you before our next scheduled appointment.  Your next care management appointment is by telephone on 02/24/2024 at 3pm  Telephone follow up appointment date/time:  02/24/2024 3 pm  Please call the care guide team at [phone] if you need to cancel, schedule, or reschedule an appointment.   Please call 911 if you are experiencing a Mental Health or Behavioral Health Crisis or need someone to talk to.  Fletcher Humble MSW, LCSW Licensed Clinical Social Worker  [facility], Population Health Direct Dial: [phone]  Fax: [phone]

## 2024-02-13 ENCOUNTER — Other Ambulatory Visit: Payer: Self-pay

## 2024-02-13 ENCOUNTER — Other Ambulatory Visit: Payer: Self-pay | Admitting: Physician Assistant

## 2024-02-13 DIAGNOSIS — F01C11 Vascular dementia, severe, with agitation: Secondary | ICD-10-CM

## 2024-02-13 NOTE — Patient Outreach (Signed)
 Complex Care Management   Visit Note  02/13/2024  Name:  Stacey Melton MRN: [id number] DOB: 1932/11/02  Situation: Referral received for Complex Care Management related to Dementia and HTN I obtained verbal consent from Caregiver.  Visit completed with patient daughter Stacey Melton  on the phone  Background:   Past Medical History:  Diagnosis Date   Allergy    COPD (chronic obstructive pulmonary disease) (HCC)    Cystocele    Depression    GERD (gastroesophageal reflux disease)    Hematuria    microscopic   Hemorrhoids    History of recurrent UTIs    Hypertension    Insomnia    Melanoma (HCC) 11/07/2023   Mid chest - needs Mohs   Mild cognitive impairment 2022   Osteoarthritis     Assessment: Patient Reported Symptoms:  Cognitive Cognitive Status: Unable to Assess, Struggling with memory recall Cognitive/Intellectual Conditions Management [RPT]: Other Other: Alzheimers      Neurological Neurological Review of Symptoms: Vision changes (macular degeneration  patient daughter reports recent onset of tremors approx two weeks ago.  primary seen as head shaking.  sometimes improves with hydration) Neurological Conditions: Dementia, Alzheimer's disease Neurological Comment: message sent to PCP and Neuro requesting recommendation  HEENT HEENT Symptoms Reported: Not assessed      Cardiovascular Cardiovascular Symptoms Reported: Not assessed    Respiratory Respiratory Symptoms Reported: Not assesed    Endocrine Patient reports the following symptoms related to hypoglycemia or hyperglycemia : Not assessed    Gastrointestinal Gastrointestinal Symptoms Reported: Not assessed      Genitourinary Genitourinary Symptoms Reported: Not assessed    Integumentary Integumentary Symptoms Reported: Not assessed, Other Other Integumentary Symptoms: patient has melanoma on bottom of right foot,  is planning surgical removal with oncology Additional Integumentary Details: RNCM recommended  [facility] RN, as per daughter patient will require twice daily dressing changes. Skin Management Strategies: Medical device Skin Comment: RNCM requested PCP place order for 2WW for ambulation and fall risk post surgery  Musculoskeletal Musculoskelatal Symptoms Reviewed: Not assessed, Difficulty walking, Unsteady gait Musculoskeletal Conditions: Other Musculoskeletal Management Strategies: Medical device Musculoskeletal Comment: patient with melanoma bottom of right foot Falls in the past year?: No Patient at Risk for Falls Due to: Impaired balance/gait  Psychosocial Psychosocial Symptoms Reported: Not assessed, Other Other Psychosocial Conditions: patient daughter reports severe agitation (sundowning) reports neuro provider recommended Depakote, but family has decided against that option due to possible side effects.  family asking for other medication options to help patient remain calm in the evening. Behavioral Health Conditions: Alzheimer's disease Behavioral Management Strategies: Coping strategies, Adequate rest, Counseling, Support system, Community resources   Quality of Family Relationships: helpful, involved, supportive      01/29/2024    1:55 PM  Depression screen PHQ 2/9  Decreased Interest 2  Down, Depressed, Hopeless 3  PHQ - 2 Score 5  Altered sleeping 0  Tired, decreased energy 2  Change in appetite 1  Feeling bad or failure about yourself  0  Trouble concentrating 3  Moving slowly or fidgety/restless 3  Suicidal thoughts 0  PHQ-9 Score 14    There were no vitals filed for this visit.  Medications Reviewed Today     Reviewed by Clarnce Crow, RN (Registered Nurse) on 02/13/24 at 1347  Med List Status: <None>   Medication Order Taking? Sig Documenting Provider Last Dose Status Informant  cholecalciferol (VITAMIN D3) 25 MCG (1000 UNIT) tablet [id number] Yes Take 1 tablet (1,000 Units total) by mouth daily. Craft,  Mirian Ames, [place]  Active   cyanocobalamin  (VITAMIN B12) 1000 MCG  tablet [id number] Yes Take 1,000 mcg by mouth daily. Wertman, Sara E, PA-C  Active Self  FLUoxetine  (PROZAC ) 20 MG tablet [id number] Yes Take 1 tablet (20 mg total) by mouth daily. Odilia Bennett, PA  Active   memantine  (NAMENDA ) 10 MG tablet [id number] Yes Take 1 tablet (10 mg at night) for 2 weeks, then increase to 1 tablet (10 mg) twice a day Rosi Converse, [place]  Active             Recommendation:   PCP Follow-up Specialty provider follow-up message sent to Neurology and PCP re: new onset of head tremors DME requests:  walker  Follow Up Plan:   Telephone follow up appointment date/time:  02/20/24   Clarnce Crow BSN RN CCM [place]  [facility], [facility] Health RN Care Manager Direct Dial: [phone] Fax: [phone]

## 2024-02-13 NOTE — Patient Instructions (Signed)
 Visit Information  Thank you for taking time to visit with me today. Please don't hesitate to contact me if I can be of assistance to you before our next scheduled appointment.  Our next appointment is by telephone on 02/20/24 at 1:30 Please call the care guide team at [phone] if you need to cancel or reschedule your appointment.   Following is a copy of your care plan:   Goals Addressed             This Visit's Progress    VBCI RN Care Plan       Problems:  Chronic Disease Management support and education needs related to Dementia and HTN Cognitive Deficits  Goal: Over the next 14 days the Caregiver will continue to work with Medical illustrator and/or Social Worker to address care management and care coordination needs related to Dementia and HTN as evidenced by adherence to care management team scheduled appointments     work with community resource care guide to address needs related to Cognitive Deficits, Inability to perform ADL's independently, Inability to perform IADL's independently, Memory Deficits, and Mental Health Concerns  as evidenced by patient and/or community resource care guide support     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 Reviewed medications including possible adding new medication for agitation in the evening hours., Emotional Support Provided to patient/caregiver, Consideration of in-home help encouraged , Discussed importance of attendance to all provider appointments, and Advised to contact provider for new or worsening symptoms  Patient Self-Care Activities:  Attend all scheduled provider appointments Call pharmacy for medication refills 3-7 days in advance of running out of medications Call provider office for new concerns or questions  Take medications as prescribed   Work with the social worker to address care coordination needs and will  continue to work with the clinical team to address health care and disease management related needs Call Oncology surgeon to request [facility] RN for assistance with wound care  Call your current PCA provider to inquire about possible increase in acuity of care provided due to upcoming surgery plan  Plan:  Telephone follow up appointment with care management team member scheduled for:  02/20/24 DME request sent to PCP             Please call the Suicide and Crisis Lifeline: 988 call the [place]  National Suicide Prevention Lifeline: 512 436 2113 or TTY: (330)587-7663 TTY (804)242-6917) to talk to a trained counselor call 1-800-273-TALK (toll free, 24 hour hotline) if you are experiencing a Mental Health or Behavioral Health Crisis or need someone to talk to.  Patient verbalizes understanding of instructions and care plan provided today and agrees to view in MyChart. Active MyChart status and patient understanding of how to access instructions and care plan via MyChart confirmed with patient.      Clarnce Crow BSN RN CCM [place]  [facility], [facility] RN Care Manager Direct Dial: [phone] Fax: [phone]

## 2024-02-18 ENCOUNTER — Encounter: Payer: Self-pay | Admitting: Physician Assistant

## 2024-02-19 ENCOUNTER — Encounter: Payer: Self-pay | Admitting: Physician Assistant

## 2024-02-19 ENCOUNTER — Ambulatory Visit: Admitting: Physician Assistant

## 2024-02-19 VITALS — BP 158/68 | HR 56 | Temp 97.8°F | Ht 65.0 in | Wt 170.0 lb

## 2024-02-19 DIAGNOSIS — R3129 Other microscopic hematuria: Secondary | ICD-10-CM | POA: Diagnosis not present

## 2024-02-19 DIAGNOSIS — F01B11 Vascular dementia, moderate, with agitation: Secondary | ICD-10-CM

## 2024-02-19 DIAGNOSIS — F418 Other specified anxiety disorders: Secondary | ICD-10-CM | POA: Diagnosis not present

## 2024-02-19 DIAGNOSIS — D229 Melanocytic nevi, unspecified: Secondary | ICD-10-CM | POA: Diagnosis not present

## 2024-02-19 DIAGNOSIS — R251 Tremor, unspecified: Secondary | ICD-10-CM | POA: Diagnosis not present

## 2024-02-19 DIAGNOSIS — R300 Dysuria: Secondary | ICD-10-CM | POA: Diagnosis not present

## 2024-02-19 LAB — POCT URINALYSIS DIP (CLINITEK)
Bilirubin, UA: NEGATIVE
Glucose, UA: NEGATIVE mg/dL
Ketones, POC UA: NEGATIVE mg/dL
Leukocytes, UA: NEGATIVE
Nitrite, UA: NEGATIVE
POC PROTEIN,UA: NEGATIVE
Spec Grav, UA: 1.015 (ref 1.010–1.025)
Urobilinogen, UA: NEGATIVE U/dL — AB
pH, UA: 6 (ref 5.0–8.0)

## 2024-02-19 MED ORDER — CIPROFLOXACIN HCL 250 MG PO TABS: 250.0000 mg | ORAL_TABLET | Freq: Two times a day (BID) | ORAL | 0 refills | Status: AC

## 2024-02-19 MED ORDER — FLUOXETINE HCL 10 MG PO CAPS: ORAL_CAPSULE | ORAL | 0 refills | Status: DC

## 2024-02-19 NOTE — Progress Notes (Unsigned)
 Acute Office Visit  Subjective:    Patient ID: Stacey Melton, female    DOB: [birth year], 89 y.o.   MRN: [id number]  Chief Complaint  Patient presents with   Possible UTI    HPI: Patient is in today for UTI.  Past Medical History:  Diagnosis Date   Allergy    COPD (chronic obstructive pulmonary disease) (HCC)    Cystocele    Depression    GERD (gastroesophageal reflux disease)    Hematuria    microscopic   Hemorrhoids    History of recurrent UTIs    Hypertension    Insomnia    Melanoma (HCC) 11/07/2023   Mid chest - needs Mohs   Mild cognitive impairment 2022   Osteoarthritis     Past Surgical History:  Procedure Laterality Date   ABDOMINAL HYSTERECTOMY     COLONOSCOPY  07/07/08   repeat in 3 yrs Dr. Randal Bury   CYSTOSCOPY  12/21/08   normal Dr. Enrigue Harvard    Family History  Problem Relation Age of Onset   Arthritis Other    Breast cancer Other    Colon cancer Other     Social History   Socioeconomic History   Marital status: Widowed    Spouse name: Not on file   Number of children: 5   Years of education: Not on file   Highest education level: 10th grade  Occupational History   Not on file  Tobacco Use   Smoking status: Never   Smokeless tobacco: Never  Substance and Sexual Activity   Alcohol use: Not Currently    Comment: rare   Drug use: No   Sexual activity: Not Currently  Other Topics Concern   Not on file  Social History Narrative   Right handed   Drinks caffeine   Lives alone   One floor home   Social Drivers of Health   Financial Resource Strain: Low Risk  (01/26/2024)   Overall Financial Resource Strain (CARDIA)    Difficulty of Paying Living Expenses: Not hard at all  Food Insecurity: No Food Insecurity (02/10/2024)   Hunger Vital Sign    Worried About Running Out of Food in the Last Year: Never true    Ran Out of Food in the Last Year: Never true  Transportation Needs: No Transportation Needs (02/10/2024)   PRAPARE -  Administrator, Civil Service (Medical): No    Lack of Transportation (Non-Medical): No  Physical Activity: Unknown (01/26/2024)   Exercise Vital Sign    Days of Exercise per Week: 0 days    Minutes of Exercise per Session: Not on file  Stress: Stress Concern Present (01/26/2024)   [employer] of Occupational Health - Occupational Stress Questionnaire    Feeling of Stress : Very much  Social Connections: Socially Isolated (01/26/2024)   Social Connection and Isolation Panel    Frequency of Communication with Friends and Family: More than three times a week    Frequency of Social Gatherings with Friends and Family: Once a week    Attends Religious Services: Never    Database administrator or Organizations: No    Attends Engineer, structural: Not on file    Marital Status: Widowed  Intimate Partner Violence: Not At Risk (02/10/2024)   Humiliation, Afraid, Rape, and Kick questionnaire    Fear of Current or Ex-Partner: No    Emotionally Abused: No    Physically Abused: No    Sexually Abused: No  Outpatient Medications Prior to Visit  Medication Sig Dispense Refill   cholecalciferol (VITAMIN D3) 25 MCG (1000 UNIT) tablet Take 1 tablet (1,000 Units total) by mouth daily. 90 tablet 1   cyanocobalamin  (VITAMIN B12) 1000 MCG tablet Take 1,000 mcg by mouth daily.     FLUoxetine  (PROZAC ) 20 MG tablet Take 1 tablet (20 mg total) by mouth daily. 90 tablet 3   memantine  (NAMENDA ) 10 MG tablet Take 1 tablet (10 mg at night) for 2 weeks, then increase to 1 tablet (10 mg) twice a day 60 tablet 11   No facility-administered medications prior to visit.    No Known Allergies  Review of Systems  Constitutional:  Negative for appetite change, fatigue and fever.  HENT:  Negative for congestion, ear pain, sinus pressure and sore throat.   Respiratory:  Negative for cough, chest tightness, shortness of breath and wheezing.   Cardiovascular:  Negative for chest pain and  palpitations.  Gastrointestinal:  Negative for abdominal pain, constipation, diarrhea, nausea and vomiting.  Genitourinary:  Positive for decreased urine volume and urgency. Negative for dysuria and hematuria.  Musculoskeletal:  Negative for arthralgias, back pain, joint swelling and myalgias.  Skin:  Negative for rash.  Neurological:  Negative for dizziness, weakness and headaches.  Psychiatric/Behavioral:  Negative for dysphoric mood. The patient is not nervous/anxious.        Objective:        02/19/2024    2:17 PM 02/04/2024   11:04 AM 01/29/2024    1:52 PM  Vitals with BMI  Height 5' 5 5' 5 5' 5  Weight 170 lbs 165 lbs 164 lbs 3 oz  BMI 28.29 27.46 27.32  Systolic 158 166 440  Diastolic 68 77 60  Pulse 56 52 81    Orthostatic VS for the past 72 hrs (Last 3 readings):  Patient Position BP Location  02/19/24 1417 Sitting Right Arm     Physical Exam  Health Maintenance Due  Topic Date Due   Medicare Annual Wellness (AWV)  03/30/2023   COVID-19 Vaccine (4 - 2024-25 season) 05/05/2023    There are no preventive care reminders to display for this patient.   Lab Results  Component Value Date   TSH 0.957 10/09/2023   Lab Results  Component Value Date   WBC 5.2 01/29/2024   HGB 14.3 01/29/2024   HCT 44.8 01/29/2024   MCV 91 01/29/2024   PLT 212 01/29/2024   Lab Results  Component Value Date   NA 141 01/29/2024   K 5.1 01/29/2024   CO2 26 01/29/2024   GLUCOSE 136 (H) 01/29/2024   BUN 17 01/29/2024   CREATININE 1.03 (H) 01/29/2024   BILITOT 0.6 01/29/2024   ALKPHOS 58 01/29/2024   AST 16 01/29/2024   ALT 14 01/29/2024   PROT 6.3 01/29/2024   ALBUMIN 4.0 01/29/2024   CALCIUM 9.4 01/29/2024   ANIONGAP 8 12/06/2023   EGFR 52 (L) 01/29/2024   GFR 55.12 (L) 10/30/2022   Lab Results  Component Value Date   CHOL 198 01/29/2024   Lab Results  Component Value Date   HDL 46 01/29/2024   Lab Results  Component Value Date   LDLCALC 133 (H) 01/29/2024    Lab Results  Component Value Date   TRIG 107 01/29/2024   Lab Results  Component Value Date   CHOLHDL 4.3 01/29/2024   Lab Results  Component Value Date   HGBA1C 5.0 01/29/2024       Assessment & Plan:  There are no diagnoses linked to this encounter.   No orders of the defined types were placed in this encounter.   No orders of the defined types were placed in this encounter.    Follow-up: No follow-ups on file.  An After Visit Summary was printed and given to the patient.    I,Lauren M Auman,acting as a Neurosurgeon for [employer], PA.,have documented all relevant documentation on the behalf of Odilia Bennett, PA,as directed by  Odilia Bennett, PA while in the presence of Odilia Bennett, [place].    Odilia Bennett, [place] [facility] [phone]

## 2024-02-19 NOTE — Patient Instructions (Signed)
 VISIT SUMMARY:  Today, we addressed your worsening shaking, possible urinary tract infection (UTI), and hydration status. We also discussed your upcoming foot surgery and transition to assisted living.  YOUR PLAN:  -URINARY TRACT INFECTION (UTI): A UTI is an infection in any part of your urinary system. We will send your urine for a culture test, and you received an injection of Rocephin . You will also take ciprofloxacin  for 3 days to treat the infection.  -TREMORS: Tremors are involuntary shaking movements. Your head tremors and weakness might be related to your medication, Prozac . We will gradually reduce your Prozac  dosage and consider starting Rexulti after tapering off Prozac .  -DEHYDRATION: Dehydration occurs when you do not drink enough fluids. We encourage you to increase your fluid intake, including water and Gatorade. You might also try flavored water to help track your intake.  -FOOT SURGERY AND WOUND CARE: You have a planned foot surgery that will require wound care. We will coordinate with your dermatologist for wound care instructions and arrange home health services for post-surgery care.  -ASSISTED LIVING TRANSITION: You are planning to move to assisted living at [facility] after your surgery. We will coordinate with the facility for your evaluation and admission, and arrange home health services during your recovery.  -GENERAL HEALTH MAINTENANCE: We have scheduled a full blood panel for cholesterol and routine checks in September.  INSTRUCTIONS:  Please follow up on your lab results tomorrow. We will adjust your treatment as needed based on the results. Maintain communication with us  if you have any questions or concerns.

## 2024-02-20 ENCOUNTER — Other Ambulatory Visit: Payer: Self-pay

## 2024-02-20 ENCOUNTER — Ambulatory Visit: Payer: Self-pay | Admitting: Physician Assistant

## 2024-02-20 LAB — CBC WITH DIFFERENTIAL/PLATELET
Basophils Absolute: 0 10*3/uL (ref 0.0–0.2)
Basos: 0 %
EOS (ABSOLUTE): 0.1 10*3/uL (ref 0.0–0.4)
Eos: 2 %
Hematocrit: 46.2 % (ref 34.0–46.6)
Hemoglobin: 14.4 g/dL (ref 11.1–15.9)
Immature Grans (Abs): 0 10*3/uL (ref 0.0–0.1)
Immature Granulocytes: 0 %
Lymphocytes Absolute: 0.6 10*3/uL — ABNORMAL LOW (ref 0.7–3.1)
Lymphs: 12 %
MCH: 28.6 pg (ref 26.6–33.0)
MCHC: 31.2 g/dL — ABNORMAL LOW (ref 31.5–35.7)
MCV: 92 fL (ref 79–97)
Monocytes Absolute: 0.6 10*3/uL (ref 0.1–0.9)
Monocytes: 11 %
Neutrophils Absolute: 3.7 10*3/uL (ref 1.4–7.0)
Neutrophils: 75 %
Platelets: 220 10*3/uL (ref 150–450)
RBC: 5.03 x10E6/uL (ref 3.77–5.28)
RDW: 13.3 % (ref 11.7–15.4)
WBC: 5 10*3/uL (ref 3.4–10.8)

## 2024-02-20 LAB — COMPREHENSIVE METABOLIC PANEL WITH GFR
ALT: 26 IU/L (ref 0–32)
AST: 24 IU/L (ref 0–40)
Albumin: 4.3 g/dL (ref 3.7–4.7)
Alkaline Phosphatase: 58 IU/L (ref 44–121)
BUN/Creatinine Ratio: 25 (ref 12–28)
BUN: 19 mg/dL (ref 8–27)
Bilirubin Total: 0.6 mg/dL (ref 0.0–1.2)
CO2: 24 mmol/L (ref 20–29)
Calcium: 9.3 mg/dL (ref 8.7–10.3)
Chloride: 101 mmol/L (ref 96–106)
Creatinine, Ser: 0.76 mg/dL (ref 0.57–1.00)
Globulin, Total: 2 g/dL (ref 1.5–4.5)
Glucose: 83 mg/dL (ref 70–99)
Potassium: 4.7 mmol/L (ref 3.5–5.2)
Sodium: 138 mmol/L (ref 134–144)
Total Protein: 6.3 g/dL (ref 6.0–8.5)
eGFR: 75 mL/min/{1.73_m2} (ref >59)

## 2024-02-20 LAB — SEDIMENTATION RATE: Sed Rate: 5 mm/h (ref 0–40)

## 2024-02-20 LAB — C-REACTIVE PROTEIN: CRP: <1 mg/L (ref 0–10)

## 2024-02-20 NOTE — Patient Instructions (Signed)
 Visit Information  Thank you for taking time to visit with me today. Please don't hesitate to contact me if I can be of assistance to you before our next scheduled appointment.  Our next appointment is by telephone on 03/05/24 at 3:00 Please call the care guide team at [phone] if you need to cancel or reschedule your appointment.   Following is a copy of your care plan:   Goals Addressed             This Visit's Progress    VBCI RN Care Plan   Worsening    Problems:  Chronic Disease Management support and education needs related to Dementia and HTN Cognitive Deficits  Goal: Over the next 14 days the Caregiver will continue to work with Medical illustrator and/or Social Worker to address care management and care coordination needs related to Dementia and HTN as evidenced by adherence to care management team scheduled appointments     work with community resource care guide to address needs related to Cognitive Deficits, Inability to perform ADL's independently, Inability to perform IADL's independently, Memory Deficits, and Mental Health Concerns  as evidenced by patient and/or community resource care guide support    Placement in [facility] is in process Patient to have surgery to remove melanoma from bottom of foot, will need [facility] RN for dressing changes.    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 Reviewed medications including adding new medication for agitation in the evening hours., Emotional Support Provided to patient/caregiver, Consideration of in-home help encouraged , Discussed importance of attendance to all provider appointments, and Advised to contact provider for new or worsening symptoms  Patient Self-Care Activities:  Attend all scheduled provider appointments Call pharmacy for medication refills 3-7 days in advance of running out of medications Call provider  office for new concerns or questions  Take medications as prescribed   Work with the social worker to address care coordination needs and will continue to work with the clinical team to address health care and disease management related needs Schedule evaluation with [facility].    Plan:  Telephone follow up appointment with care management team member scheduled for:  07/03//25             Please call the Suicide and Crisis Lifeline: 988 call the [place]  National Suicide Prevention Lifeline: 204-597-5486 or TTY: (502) 754-9509 TTY (720)796-7310) to talk to a trained counselor call 1-800-273-TALK (toll free, 24 hour hotline) if you are experiencing a Mental Health or Behavioral Health Crisis or need someone to talk to.  Patient verbalizes understanding of instructions and care plan provided today and agrees to view in MyChart. Active MyChart status and patient understanding of how to access instructions and care plan via MyChart confirmed with patient.      Clarnce Crow BSN RN CCM [place]  [facility], [facility] Health RN Care Manager Direct Dial: [phone] Fax: [phone]

## 2024-02-20 NOTE — Patient Outreach (Signed)
 Complex Care Management   Visit Note  02/20/2024  Name:  Stacey Melton MRN: [id number] DOB: 05-25-1933  Situation: Referral received for Complex Care Management related to Dementia I obtained verbal consent from Caregiver.  Visit completed with patient daughter Stacey Melton  on the phone  Background:   Past Medical History:  Diagnosis Date   Allergy    COPD (chronic obstructive pulmonary disease) (HCC)    Cystocele    Depression    GERD (gastroesophageal reflux disease)    Hematuria    microscopic   Hemorrhoids    History of recurrent UTIs    Hypertension    Insomnia    Melanoma (HCC) 11/07/2023   Mid chest - needs Mohs   Mild cognitive impairment 2022   Osteoarthritis     Assessment: Patient Reported Symptoms:  Cognitive Cognitive Status: Unable to Assess (call completed with patient daughter Stacey Melton)      Neurological   Neurological Conditions: Alzheimer's disease Neurological Comment: provider is titrating off Prozac  r/t tremors, will start new meds to help address behavioral concerns  HEENT HEENT Symptoms Reported: No symptoms reported      Cardiovascular Cardiovascular Symptoms Reported: No symptoms reported    Respiratory Respiratory Symptoms Reported: Shortness of breath Other Respiratory Symptoms: SOB r/t to exertion, deconditioning Respiratory Conditions: Shortness of breath Respiratory Comment: due to deconditioning  Endocrine Patient reports the following symptoms related to hypoglycemia or hyperglycemia : No symptoms reported Is patient diabetic?: No    Gastrointestinal Gastrointestinal Symptoms Reported: Incontinence Gastrointestinal Management Strategies: Incontinence garment/pad    Genitourinary Genitourinary Symptoms Reported: No symptoms reported Additional Genitourinary Details: patient recently seen for UTI, lab results negative, patient on abx Genitourinary Conditions: Difficulty voiding Genitourinary Management Strategies: Medication  therapy  Integumentary Integumentary Symptoms Reported: No symptoms reported Other Integumentary Symptoms: surgery to remove melanoma pending placement in ALF Skin Management Strategies: Routine screening  Musculoskeletal Musculoskelatal Symptoms Reviewed: Difficulty walking, Unsteady gait, Weakness Musculoskeletal Conditions: Other Musculoskeletal Management Strategies: Medical device      Psychosocial Psychosocial Symptoms Reported: Other Other Psychosocial Conditions: patient daughter is arranging for placement at [facility].  Provider has downtitrated Prozac  and will replace with Rrexulti Behavioral Health Conditions: Alzheimer's disease Behavioral Management Strategies: Medication therapy, Adequate rest, Coping strategies, Optimal nutrition intake, Support system          01/29/2024    1:55 PM  Depression screen PHQ 2/9  Decreased Interest 2  Down, Depressed, Hopeless 3  PHQ - 2 Score 5  Altered sleeping 0  Tired, decreased energy 2  Change in appetite 1  Feeling bad or failure about yourself  0  Trouble concentrating 3  Moving slowly or fidgety/restless 3  Suicidal thoughts 0  PHQ-9 Score 14    There were no vitals filed for this visit.  Medications Reviewed Today     Reviewed by Stacey Crow, RN (Registered Nurse) on 02/20/24 at 1345  Med List Status: <None>   Medication Order Taking? Sig Documenting Provider Last Dose Status Informant  cholecalciferol (VITAMIN D3) 25 MCG (1000 UNIT) tablet [id number] Yes Take 1 tablet (1,000 Units total) by mouth daily. Odilia Bennett, PA  Active   ciprofloxacin  (CIPRO ) 250 MG tablet [id number] Yes Take 1 tablet (250 mg total) by mouth 2 (two) times daily for 3 days. Odilia Bennett, PA  Active   cyanocobalamin  (VITAMIN B12) 1000 MCG tablet [id number] Yes Take 1,000 mcg by mouth daily. Wertman, Sara E, PA-C  Active Self  FLUoxetine  (PROZAC ) 10 MG capsule [id number] Yes Take  one tablet daily for one week. Then take one tablet  every other day for one week. Odilia Bennett, PA  Active   memantine  (NAMENDA ) 10 MG tablet [id number] Yes Take 1 tablet (10 mg at night) for 2 weeks, then increase to 1 tablet (10 mg) twice a day Wertman, Sara E, PA-C  Active             Recommendation:   Continue Current Plan of Care  Patient to be moved to [facility] in [place]. Patient to undergo surgery to remove melanoma from bottom of foot. [facility] has confirmed that they can provide 3x /weekly dressing changes.   Follow Up Plan:   Telephone follow up appointment date/time:  03/05/24   Stacey Melton BSN RN CCM [place]  [facility], [facility] Health RN Care Manager Direct Dial: [phone] Fax: [phone]

## 2024-02-21 LAB — URINE CULTURE

## 2024-02-26 ENCOUNTER — Ambulatory Visit (INDEPENDENT_AMBULATORY_CARE_PROVIDER_SITE_OTHER)

## 2024-02-26 DIAGNOSIS — R251 Tremor, unspecified: Secondary | ICD-10-CM | POA: Insufficient documentation

## 2024-02-26 DIAGNOSIS — Z022 Encounter for examination for admission to residential institution: Secondary | ICD-10-CM | POA: Diagnosis not present

## 2024-02-26 DIAGNOSIS — R300 Dysuria: Secondary | ICD-10-CM | POA: Insufficient documentation

## 2024-02-26 DIAGNOSIS — R3129 Other microscopic hematuria: Secondary | ICD-10-CM | POA: Insufficient documentation

## 2024-02-26 NOTE — Assessment & Plan Note (Signed)
 Mohs surgery planned, requiring home health services for wound care as assisted living cannot manage. - Coordinate with dermatologist for wound care instructions and home health orders. - Arrange home health services for post-surgery care.

## 2024-02-26 NOTE — Assessment & Plan Note (Signed)
 Inadequate fluid intake possibly contributing to symptoms. Dehydration tremors unlikely due to isolated head tremors. - Encourage increased fluid intake, including water and Gatorade. - Consider flavored water to track intake.

## 2024-02-26 NOTE — Assessment & Plan Note (Signed)
 Worsening head tremors and weakness, possibly Prozac -related. Memantine  unlikely cause. Discussed switching to Rexulti for Alzheimer's dementia with agitation. - Wean off Prozac : 10 mg daily for one week, then every other day for one week, then stop. - Consider starting Rexulti after Prozac  tapering.

## 2024-02-26 NOTE — Assessment & Plan Note (Signed)
 Planning move to assisted living at [facility] post-surgery. Discussed evaluation and admission process. - Coordinate with assisted living for evaluation and admission. - Arrange home health services during recovery.

## 2024-02-26 NOTE — Assessment & Plan Note (Signed)
 Possible UTI with hematuria, no pyuria. Differential includes cystitis or pyelonephritis. Blood pressure and heart rate patterns align with previous severe UTI episodes. - Send urine for culture. - Order CBC, renal and hepatic function, and inflammatory markers. - Administer Rocephin  injection. - Prescribe ciprofloxacin  for 3 days.

## 2024-02-26 NOTE — Progress Notes (Signed)
Patient is in office today for a nurse visit for PPD. Patient Injection was given in the  Left arm. Patient tolerated injection well.

## 2024-02-27 ENCOUNTER — Telehealth: Payer: Self-pay

## 2024-02-27 NOTE — Telephone Encounter (Signed)
 Copied from CRM [id number]. Topic: General - Other >> Feb 27, 2024 10:28 AM Dawna HERO wrote: Reason for CRM: patients daughter called in to ask if these things could be added to her appointment on tomorrow for her admission into the living facility she stated pneumonia shot,  flu shot ,& urine test for bacteria

## 2024-02-28 ENCOUNTER — Ambulatory Visit

## 2024-02-28 LAB — TB SKIN TEST
Induration: 0 mm
TB Skin Test: NEGATIVE

## 2024-02-28 NOTE — Telephone Encounter (Signed)
 Appointment notes have been updated

## 2024-02-28 NOTE — Telephone Encounter (Signed)
 Flu shot is current not available.

## 2024-02-28 NOTE — Progress Notes (Signed)
 Patient was here for TB test results. It was done on 02/26/24. Her TB test was negative. I gave paperwork for [facility] ([facility]). Her daughter took the paperwork to turn in next week.  Copy was done.

## 2024-03-01 ENCOUNTER — Ambulatory Visit: Payer: Self-pay | Admitting: Physician Assistant

## 2024-03-05 ENCOUNTER — Other Ambulatory Visit: Payer: Self-pay

## 2024-03-05 NOTE — Patient Instructions (Signed)
 Visit Information  Thank you for taking time to visit with me today. Please don't hesitate to contact me if I can be of assistance to you before our next scheduled appointment.  Our next appointment is no further scheduled appointments.  on  at  Please call the care guide team at [phone] if you need to cancel or reschedule your appointment.   Following is a copy of your care plan:   Goals Addressed             This Visit's Progress    COMPLETED: VBCI RN Care Plan   On track    Problems:  Chronic Disease Management support and education needs related to Dementia and HTN Cognitive Deficits  Goal: Over the next 14 days the Caregiver will continue to work with Medical illustrator and/or Social Worker to address care management and care coordination needs related to Dementia and HTN as evidenced by adherence to care management team scheduled appointments     work with community resource care guide to address needs related to Cognitive Deficits, Inability to perform ADL's independently, Inability to perform IADL's independently, Memory Deficits, and Mental Health Concerns  as evidenced by patient and/or community resource care guide support    Placement in [facility] is in process Patient to have surgery to remove melanoma from bottom of foot, will need [facility] RN for dressing changes.    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 Reviewed medications including adding new medication for agitation in the evening hours., Emotional Support Provided to patient/caregiver, Consideration of in-home help encouraged , Discussed importance of attendance to all provider appointments, and Advised to contact provider for new or worsening symptoms  Patient Self-Care Activities:  Attend all scheduled provider appointments Call pharmacy for medication refills 3-7 days in advance of running out of  medications Call provider office for new concerns or questions  Take medications as prescribed   Work with the social worker to address care coordination needs and will continue to work with the clinical team to address health care and disease management related needs Schedule evaluation with [facility].    Plan:  Patient is moving into [facility] 03/09/24.  Patient daughter [name] has RNCM contact information for any further CM needs.  Closing enrollment             Please call the Suicide and Crisis Lifeline: 988 call the [place]  National Suicide Prevention Lifeline: (559)643-1934 or TTY: 551-121-5956 TTY (848) 324-4006) to talk to a trained counselor call 1-800-273-TALK (toll free, 24 hour hotline) if you are experiencing a Mental Health or Behavioral Health Crisis or need someone to talk to.  Patient verbalizes understanding of instructions and care plan provided today and agrees to view in MyChart. Active MyChart status and patient understanding of how to access instructions and care plan via MyChart confirmed with patient.     SIGNATURE  Olam Idol BSN RN CCM [place]  [facility], [facility] Health RN Care Manager Direct Dial: [phone] Fax: [phone]

## 2024-03-05 NOTE — Patient Outreach (Signed)
 Complex Care Management   Visit Note  03/05/2024  Name:  Stacey Melton MRN: [id number] DOB: 12-May-1933  Situation: Referral received for Complex Care Management related to Dementia I obtained verbal consent from Caregiver.  Visit completed with patient daughter Stacey Melton  on the phone  Background:   Past Medical History:  Diagnosis Date   Allergy    COPD (chronic obstructive pulmonary disease) (HCC)    Cystocele    Depression    GERD (gastroesophageal reflux disease)    Hematuria    microscopic   Hemorrhoids    History of recurrent UTIs    Hypertension    Insomnia    Melanoma (HCC) 11/07/2023   Mid chest - needs Mohs   Mild cognitive impairment 2022   Osteoarthritis     Assessment: Patient Reported Symptoms:  Cognitive Cognitive Status: Unable to Assess      Neurological Neurological Review of Symptoms: Not assessed    HEENT HEENT Symptoms Reported: Not assessed      Cardiovascular Cardiovascular Symptoms Reported: Not assessed    Respiratory Respiratory Symptoms Reported: Not assesed    Endocrine Endocrine Symptoms Reported: Not assessed    Gastrointestinal Gastrointestinal Symptoms Reported: Not assessed      Genitourinary Genitourinary Symptoms Reported: Not assessed    Integumentary Integumentary Symptoms Reported: Not assessed    Musculoskeletal Musculoskelatal Symptoms Reviewed: Not assessed        Psychosocial Psychosocial Symptoms Reported: Not assessed            01/29/2024    1:55 PM  Depression screen PHQ 2/9  Decreased Interest 2  Down, Depressed, Hopeless 3  PHQ - 2 Score 5  Altered sleeping 0  Tired, decreased energy 2  Change in appetite 1  Feeling bad or failure about yourself  0  Trouble concentrating 3  Moving slowly or fidgety/restless 3  Suicidal thoughts 0  PHQ-9 Score 14    There were no vitals filed for this visit.  Medications Reviewed Today   Medications were not reviewed in this encounter      Recommendation:   Continue Current Plan of Care  Patient is moving to [facility] on 03/09/24.  Closing from CCM enrollment.  Patient daughter was provided with RNCM contact information for future need.  Follow Up Plan:   Closing From:  Complex Care Management  SIG  Olam Idol BSN RN CCM [place]  [facility], [facility] RN Care Manager Direct Dial: [phone] Fax: [phone]

## 2024-03-09 DIAGNOSIS — F0150 Vascular dementia without behavioral disturbance: Secondary | ICD-10-CM

## 2024-03-09 DIAGNOSIS — F330 Major depressive disorder, recurrent, mild: Secondary | ICD-10-CM | POA: Diagnosis not present

## 2024-03-09 DIAGNOSIS — J449 Chronic obstructive pulmonary disease, unspecified: Secondary | ICD-10-CM | POA: Diagnosis not present

## 2024-03-09 DIAGNOSIS — M6281 Muscle weakness (generalized): Secondary | ICD-10-CM | POA: Diagnosis not present

## 2024-03-09 DIAGNOSIS — M1990 Unspecified osteoarthritis, unspecified site: Secondary | ICD-10-CM | POA: Diagnosis not present

## 2024-03-09 DIAGNOSIS — E559 Vitamin D deficiency, unspecified: Secondary | ICD-10-CM | POA: Diagnosis not present

## 2024-03-09 DIAGNOSIS — E538 Deficiency of other specified B group vitamins: Secondary | ICD-10-CM | POA: Diagnosis not present

## 2024-03-09 DIAGNOSIS — I10 Essential (primary) hypertension: Secondary | ICD-10-CM | POA: Diagnosis not present

## 2024-03-09 DIAGNOSIS — G894 Chronic pain syndrome: Secondary | ICD-10-CM | POA: Diagnosis not present

## 2024-03-10 DIAGNOSIS — D519 Vitamin B12 deficiency anemia, unspecified: Secondary | ICD-10-CM | POA: Diagnosis not present

## 2024-03-10 DIAGNOSIS — Z79899 Other long term (current) drug therapy: Secondary | ICD-10-CM | POA: Diagnosis not present

## 2024-03-10 DIAGNOSIS — I10 Essential (primary) hypertension: Secondary | ICD-10-CM | POA: Diagnosis not present

## 2024-03-10 DIAGNOSIS — Z131 Encounter for screening for diabetes mellitus: Secondary | ICD-10-CM | POA: Diagnosis not present

## 2024-03-10 DIAGNOSIS — Z130 Encounter for screening for diseases of the blood and blood-forming organs and certain disorders involving the immune mechanism: Secondary | ICD-10-CM | POA: Diagnosis not present

## 2024-03-10 DIAGNOSIS — E559 Vitamin D deficiency, unspecified: Secondary | ICD-10-CM | POA: Diagnosis not present

## 2024-03-10 DIAGNOSIS — Z1329 Encounter for screening for other suspected endocrine disorder: Secondary | ICD-10-CM | POA: Diagnosis not present

## 2024-03-11 DIAGNOSIS — I10 Essential (primary) hypertension: Secondary | ICD-10-CM | POA: Diagnosis not present

## 2024-03-11 DIAGNOSIS — F03918 Unspecified dementia, unspecified severity, with other behavioral disturbance: Secondary | ICD-10-CM | POA: Diagnosis not present

## 2024-03-11 DIAGNOSIS — R451 Restlessness and agitation: Secondary | ICD-10-CM | POA: Diagnosis not present

## 2024-03-11 DIAGNOSIS — F419 Anxiety disorder, unspecified: Secondary | ICD-10-CM | POA: Diagnosis not present

## 2024-03-13 DIAGNOSIS — R451 Restlessness and agitation: Secondary | ICD-10-CM | POA: Diagnosis not present

## 2024-03-13 DIAGNOSIS — F03918 Unspecified dementia, unspecified severity, with other behavioral disturbance: Secondary | ICD-10-CM | POA: Diagnosis not present

## 2024-03-13 DIAGNOSIS — F419 Anxiety disorder, unspecified: Secondary | ICD-10-CM | POA: Diagnosis not present

## 2024-03-16 DIAGNOSIS — N390 Urinary tract infection, site not specified: Secondary | ICD-10-CM | POA: Diagnosis not present

## 2024-03-18 ENCOUNTER — Telehealth: Payer: Self-pay

## 2024-03-18 DIAGNOSIS — B961 Klebsiella pneumoniae [K. pneumoniae] as the cause of diseases classified elsewhere: Secondary | ICD-10-CM

## 2024-03-18 DIAGNOSIS — W19XXXA Unspecified fall, initial encounter: Secondary | ICD-10-CM | POA: Diagnosis not present

## 2024-03-18 DIAGNOSIS — F03918 Unspecified dementia, unspecified severity, with other behavioral disturbance: Secondary | ICD-10-CM

## 2024-03-18 DIAGNOSIS — M6281 Muscle weakness (generalized): Secondary | ICD-10-CM | POA: Diagnosis not present

## 2024-03-18 DIAGNOSIS — R5381 Other malaise: Secondary | ICD-10-CM | POA: Diagnosis not present

## 2024-03-18 DIAGNOSIS — N390 Urinary tract infection, site not specified: Secondary | ICD-10-CM | POA: Diagnosis not present

## 2024-03-18 DIAGNOSIS — M25551 Pain in right hip: Secondary | ICD-10-CM | POA: Diagnosis not present

## 2024-03-18 DIAGNOSIS — R350 Frequency of micturition: Secondary | ICD-10-CM | POA: Diagnosis not present

## 2024-03-18 DIAGNOSIS — R1030 Lower abdominal pain, unspecified: Secondary | ICD-10-CM | POA: Diagnosis not present

## 2024-03-18 NOTE — Patient Outreach (Signed)
 RNCM Telephone Encounter Note  Received request from patient daughter to ask PCP to place DME order for rollator walker with seat. Patient recently placed at [facility], had non injury fall this morning.  Request sent to PCP.   Olam Idol BSN RN CCM [place]  [facility], [facility] RN Care Manager Direct Dial: [phone] Fax: [phone]

## 2024-03-23 DIAGNOSIS — M2012 Hallux valgus (acquired), left foot: Secondary | ICD-10-CM | POA: Diagnosis not present

## 2024-03-23 DIAGNOSIS — R600 Localized edema: Secondary | ICD-10-CM | POA: Diagnosis not present

## 2024-03-23 DIAGNOSIS — B351 Tinea unguium: Secondary | ICD-10-CM | POA: Diagnosis not present

## 2024-03-23 DIAGNOSIS — M6281 Muscle weakness (generalized): Secondary | ICD-10-CM | POA: Diagnosis not present

## 2024-03-23 DIAGNOSIS — I7091 Generalized atherosclerosis: Secondary | ICD-10-CM | POA: Diagnosis not present

## 2024-03-23 DIAGNOSIS — R5381 Other malaise: Secondary | ICD-10-CM | POA: Diagnosis not present

## 2024-03-23 DIAGNOSIS — F0150 Vascular dementia without behavioral disturbance: Secondary | ICD-10-CM | POA: Diagnosis not present

## 2024-03-23 DIAGNOSIS — M2011 Hallux valgus (acquired), right foot: Secondary | ICD-10-CM | POA: Diagnosis not present

## 2024-03-24 DIAGNOSIS — Z87440 Personal history of urinary (tract) infections: Secondary | ICD-10-CM | POA: Diagnosis not present

## 2024-03-24 DIAGNOSIS — J449 Chronic obstructive pulmonary disease, unspecified: Secondary | ICD-10-CM | POA: Diagnosis not present

## 2024-03-24 DIAGNOSIS — B348 Other viral infections of unspecified site: Secondary | ICD-10-CM | POA: Diagnosis not present

## 2024-03-24 DIAGNOSIS — Z79899 Other long term (current) drug therapy: Secondary | ICD-10-CM | POA: Diagnosis not present

## 2024-03-24 DIAGNOSIS — I160 Hypertensive urgency: Secondary | ICD-10-CM | POA: Diagnosis not present

## 2024-03-24 DIAGNOSIS — I5031 Acute diastolic (congestive) heart failure: Secondary | ICD-10-CM | POA: Diagnosis not present

## 2024-03-24 DIAGNOSIS — A419 Sepsis, unspecified organism: Secondary | ICD-10-CM | POA: Diagnosis not present

## 2024-03-24 DIAGNOSIS — I509 Heart failure, unspecified: Secondary | ICD-10-CM | POA: Diagnosis not present

## 2024-03-24 DIAGNOSIS — Z23 Encounter for immunization: Secondary | ICD-10-CM | POA: Diagnosis not present

## 2024-03-24 DIAGNOSIS — I340 Nonrheumatic mitral (valve) insufficiency: Secondary | ICD-10-CM | POA: Diagnosis not present

## 2024-03-24 DIAGNOSIS — Z1152 Encounter for screening for COVID-19: Secondary | ICD-10-CM | POA: Diagnosis not present

## 2024-03-24 DIAGNOSIS — I4719 Other supraventricular tachycardia: Secondary | ICD-10-CM | POA: Diagnosis not present

## 2024-03-24 DIAGNOSIS — R6889 Other general symptoms and signs: Secondary | ICD-10-CM | POA: Diagnosis not present

## 2024-03-24 DIAGNOSIS — R9431 Abnormal electrocardiogram [ECG] [EKG]: Secondary | ICD-10-CM | POA: Diagnosis not present

## 2024-03-24 DIAGNOSIS — R001 Bradycardia, unspecified: Secondary | ICD-10-CM | POA: Diagnosis not present

## 2024-03-24 DIAGNOSIS — I110 Hypertensive heart disease with heart failure: Secondary | ICD-10-CM | POA: Diagnosis not present

## 2024-03-24 DIAGNOSIS — R609 Edema, unspecified: Secondary | ICD-10-CM | POA: Diagnosis not present

## 2024-03-25 DIAGNOSIS — I340 Nonrheumatic mitral (valve) insufficiency: Secondary | ICD-10-CM | POA: Diagnosis not present

## 2024-03-26 DIAGNOSIS — I4719 Other supraventricular tachycardia: Secondary | ICD-10-CM | POA: Diagnosis not present

## 2024-03-26 DIAGNOSIS — I5031 Acute diastolic (congestive) heart failure: Secondary | ICD-10-CM | POA: Diagnosis not present

## 2024-03-26 DIAGNOSIS — R001 Bradycardia, unspecified: Secondary | ICD-10-CM | POA: Diagnosis not present

## 2024-03-26 DIAGNOSIS — I160 Hypertensive urgency: Secondary | ICD-10-CM | POA: Diagnosis not present

## 2024-03-27 DIAGNOSIS — I4719 Other supraventricular tachycardia: Secondary | ICD-10-CM | POA: Diagnosis not present

## 2024-03-27 DIAGNOSIS — I160 Hypertensive urgency: Secondary | ICD-10-CM | POA: Diagnosis not present

## 2024-03-31 DIAGNOSIS — Z556 Problems related to health literacy: Secondary | ICD-10-CM | POA: Diagnosis not present

## 2024-03-31 DIAGNOSIS — I110 Hypertensive heart disease with heart failure: Secondary | ICD-10-CM | POA: Diagnosis not present

## 2024-03-31 DIAGNOSIS — I080 Rheumatic disorders of both mitral and aortic valves: Secondary | ICD-10-CM | POA: Diagnosis not present

## 2024-03-31 DIAGNOSIS — J449 Chronic obstructive pulmonary disease, unspecified: Secondary | ICD-10-CM | POA: Diagnosis not present

## 2024-03-31 DIAGNOSIS — I5031 Acute diastolic (congestive) heart failure: Secondary | ICD-10-CM | POA: Diagnosis not present

## 2024-03-31 DIAGNOSIS — I160 Hypertensive urgency: Secondary | ICD-10-CM | POA: Diagnosis not present

## 2024-03-31 DIAGNOSIS — B348 Other viral infections of unspecified site: Secondary | ICD-10-CM | POA: Diagnosis not present

## 2024-03-31 DIAGNOSIS — N390 Urinary tract infection, site not specified: Secondary | ICD-10-CM | POA: Diagnosis not present

## 2024-04-01 DIAGNOSIS — Z556 Problems related to health literacy: Secondary | ICD-10-CM | POA: Diagnosis not present

## 2024-04-02 DIAGNOSIS — I5031 Acute diastolic (congestive) heart failure: Secondary | ICD-10-CM | POA: Diagnosis not present

## 2024-04-02 DIAGNOSIS — N390 Urinary tract infection, site not specified: Secondary | ICD-10-CM | POA: Diagnosis not present

## 2024-04-02 DIAGNOSIS — B348 Other viral infections of unspecified site: Secondary | ICD-10-CM | POA: Diagnosis not present

## 2024-04-02 DIAGNOSIS — J449 Chronic obstructive pulmonary disease, unspecified: Secondary | ICD-10-CM | POA: Diagnosis not present

## 2024-04-02 DIAGNOSIS — I160 Hypertensive urgency: Secondary | ICD-10-CM | POA: Diagnosis not present

## 2024-04-02 DIAGNOSIS — Z556 Problems related to health literacy: Secondary | ICD-10-CM | POA: Diagnosis not present

## 2024-04-02 DIAGNOSIS — I110 Hypertensive heart disease with heart failure: Secondary | ICD-10-CM | POA: Diagnosis not present

## 2024-04-02 DIAGNOSIS — I080 Rheumatic disorders of both mitral and aortic valves: Secondary | ICD-10-CM | POA: Diagnosis not present

## 2024-04-03 DIAGNOSIS — I509 Heart failure, unspecified: Secondary | ICD-10-CM | POA: Diagnosis not present

## 2024-04-03 DIAGNOSIS — I110 Hypertensive heart disease with heart failure: Secondary | ICD-10-CM | POA: Diagnosis not present

## 2024-04-03 DIAGNOSIS — I160 Hypertensive urgency: Secondary | ICD-10-CM | POA: Diagnosis not present

## 2024-04-03 DIAGNOSIS — I5031 Acute diastolic (congestive) heart failure: Secondary | ICD-10-CM | POA: Diagnosis not present

## 2024-04-03 DIAGNOSIS — Z556 Problems related to health literacy: Secondary | ICD-10-CM | POA: Diagnosis not present

## 2024-04-03 DIAGNOSIS — I080 Rheumatic disorders of both mitral and aortic valves: Secondary | ICD-10-CM | POA: Diagnosis not present

## 2024-04-03 DIAGNOSIS — B348 Other viral infections of unspecified site: Secondary | ICD-10-CM | POA: Diagnosis not present

## 2024-04-03 DIAGNOSIS — I4719 Other supraventricular tachycardia: Secondary | ICD-10-CM | POA: Diagnosis not present

## 2024-04-03 DIAGNOSIS — N390 Urinary tract infection, site not specified: Secondary | ICD-10-CM | POA: Diagnosis not present

## 2024-04-03 DIAGNOSIS — J449 Chronic obstructive pulmonary disease, unspecified: Secondary | ICD-10-CM | POA: Diagnosis not present

## 2024-04-03 DIAGNOSIS — I10 Essential (primary) hypertension: Secondary | ICD-10-CM | POA: Diagnosis not present

## 2024-04-06 DIAGNOSIS — J449 Chronic obstructive pulmonary disease, unspecified: Secondary | ICD-10-CM | POA: Diagnosis not present

## 2024-04-06 DIAGNOSIS — F0390 Unspecified dementia without behavioral disturbance: Secondary | ICD-10-CM

## 2024-04-06 DIAGNOSIS — R600 Localized edema: Secondary | ICD-10-CM | POA: Diagnosis not present

## 2024-04-06 DIAGNOSIS — B348 Other viral infections of unspecified site: Secondary | ICD-10-CM | POA: Diagnosis not present

## 2024-04-06 DIAGNOSIS — F419 Anxiety disorder, unspecified: Secondary | ICD-10-CM

## 2024-04-06 DIAGNOSIS — I5031 Acute diastolic (congestive) heart failure: Secondary | ICD-10-CM | POA: Diagnosis not present

## 2024-04-06 DIAGNOSIS — N390 Urinary tract infection, site not specified: Secondary | ICD-10-CM | POA: Diagnosis not present

## 2024-04-06 DIAGNOSIS — Z556 Problems related to health literacy: Secondary | ICD-10-CM | POA: Diagnosis not present

## 2024-04-06 DIAGNOSIS — I160 Hypertensive urgency: Secondary | ICD-10-CM | POA: Diagnosis not present

## 2024-04-06 DIAGNOSIS — I080 Rheumatic disorders of both mitral and aortic valves: Secondary | ICD-10-CM | POA: Diagnosis not present

## 2024-04-06 DIAGNOSIS — I110 Hypertensive heart disease with heart failure: Secondary | ICD-10-CM | POA: Diagnosis not present

## 2024-04-09 DIAGNOSIS — Z556 Problems related to health literacy: Secondary | ICD-10-CM | POA: Diagnosis not present

## 2024-04-09 DIAGNOSIS — I080 Rheumatic disorders of both mitral and aortic valves: Secondary | ICD-10-CM | POA: Diagnosis not present

## 2024-04-09 DIAGNOSIS — I110 Hypertensive heart disease with heart failure: Secondary | ICD-10-CM | POA: Diagnosis not present

## 2024-04-09 DIAGNOSIS — N390 Urinary tract infection, site not specified: Secondary | ICD-10-CM | POA: Diagnosis not present

## 2024-04-09 DIAGNOSIS — I160 Hypertensive urgency: Secondary | ICD-10-CM | POA: Diagnosis not present

## 2024-04-09 DIAGNOSIS — B348 Other viral infections of unspecified site: Secondary | ICD-10-CM | POA: Diagnosis not present

## 2024-04-09 DIAGNOSIS — J449 Chronic obstructive pulmonary disease, unspecified: Secondary | ICD-10-CM | POA: Diagnosis not present

## 2024-04-13 DIAGNOSIS — N390 Urinary tract infection, site not specified: Secondary | ICD-10-CM | POA: Diagnosis not present

## 2024-04-13 DIAGNOSIS — J449 Chronic obstructive pulmonary disease, unspecified: Secondary | ICD-10-CM | POA: Diagnosis not present

## 2024-04-13 DIAGNOSIS — I080 Rheumatic disorders of both mitral and aortic valves: Secondary | ICD-10-CM | POA: Diagnosis not present

## 2024-04-13 DIAGNOSIS — Z556 Problems related to health literacy: Secondary | ICD-10-CM | POA: Diagnosis not present

## 2024-04-13 DIAGNOSIS — B348 Other viral infections of unspecified site: Secondary | ICD-10-CM | POA: Diagnosis not present

## 2024-04-13 DIAGNOSIS — I110 Hypertensive heart disease with heart failure: Secondary | ICD-10-CM | POA: Diagnosis not present

## 2024-04-13 DIAGNOSIS — I160 Hypertensive urgency: Secondary | ICD-10-CM | POA: Diagnosis not present

## 2024-04-13 DIAGNOSIS — I5031 Acute diastolic (congestive) heart failure: Secondary | ICD-10-CM | POA: Diagnosis not present

## 2024-04-14 ENCOUNTER — Encounter: Payer: Self-pay | Admitting: Internal Medicine

## 2024-04-15 DIAGNOSIS — J449 Chronic obstructive pulmonary disease, unspecified: Secondary | ICD-10-CM | POA: Diagnosis not present

## 2024-04-15 DIAGNOSIS — Z556 Problems related to health literacy: Secondary | ICD-10-CM | POA: Diagnosis not present

## 2024-04-15 DIAGNOSIS — I110 Hypertensive heart disease with heart failure: Secondary | ICD-10-CM | POA: Diagnosis not present

## 2024-04-15 DIAGNOSIS — N390 Urinary tract infection, site not specified: Secondary | ICD-10-CM | POA: Diagnosis not present

## 2024-04-15 DIAGNOSIS — I080 Rheumatic disorders of both mitral and aortic valves: Secondary | ICD-10-CM | POA: Diagnosis not present

## 2024-04-15 DIAGNOSIS — B348 Other viral infections of unspecified site: Secondary | ICD-10-CM | POA: Diagnosis not present

## 2024-04-15 DIAGNOSIS — I160 Hypertensive urgency: Secondary | ICD-10-CM | POA: Diagnosis not present

## 2024-04-15 DIAGNOSIS — I10 Essential (primary) hypertension: Secondary | ICD-10-CM | POA: Diagnosis not present

## 2024-04-15 DIAGNOSIS — I5031 Acute diastolic (congestive) heart failure: Secondary | ICD-10-CM | POA: Diagnosis not present

## 2024-04-15 DIAGNOSIS — I509 Heart failure, unspecified: Secondary | ICD-10-CM | POA: Diagnosis not present

## 2024-04-15 DIAGNOSIS — I4719 Other supraventricular tachycardia: Secondary | ICD-10-CM | POA: Diagnosis not present

## 2024-04-16 DIAGNOSIS — I160 Hypertensive urgency: Secondary | ICD-10-CM | POA: Diagnosis not present

## 2024-04-16 DIAGNOSIS — Z556 Problems related to health literacy: Secondary | ICD-10-CM | POA: Diagnosis not present

## 2024-04-16 DIAGNOSIS — I080 Rheumatic disorders of both mitral and aortic valves: Secondary | ICD-10-CM | POA: Diagnosis not present

## 2024-04-16 DIAGNOSIS — I110 Hypertensive heart disease with heart failure: Secondary | ICD-10-CM | POA: Diagnosis not present

## 2024-04-16 DIAGNOSIS — I5031 Acute diastolic (congestive) heart failure: Secondary | ICD-10-CM | POA: Diagnosis not present

## 2024-04-16 DIAGNOSIS — J449 Chronic obstructive pulmonary disease, unspecified: Secondary | ICD-10-CM | POA: Diagnosis not present

## 2024-04-16 DIAGNOSIS — B348 Other viral infections of unspecified site: Secondary | ICD-10-CM | POA: Diagnosis not present

## 2024-04-16 DIAGNOSIS — N390 Urinary tract infection, site not specified: Secondary | ICD-10-CM | POA: Diagnosis not present

## 2024-04-21 DIAGNOSIS — I5031 Acute diastolic (congestive) heart failure: Secondary | ICD-10-CM | POA: Diagnosis not present

## 2024-04-24 DIAGNOSIS — B348 Other viral infections of unspecified site: Secondary | ICD-10-CM | POA: Diagnosis not present

## 2024-04-24 DIAGNOSIS — I5031 Acute diastolic (congestive) heart failure: Secondary | ICD-10-CM | POA: Diagnosis not present

## 2024-04-24 DIAGNOSIS — I160 Hypertensive urgency: Secondary | ICD-10-CM | POA: Diagnosis not present

## 2024-04-24 DIAGNOSIS — N390 Urinary tract infection, site not specified: Secondary | ICD-10-CM | POA: Diagnosis not present

## 2024-04-24 DIAGNOSIS — J449 Chronic obstructive pulmonary disease, unspecified: Secondary | ICD-10-CM | POA: Diagnosis not present

## 2024-04-24 DIAGNOSIS — Z556 Problems related to health literacy: Secondary | ICD-10-CM | POA: Diagnosis not present

## 2024-04-24 DIAGNOSIS — I110 Hypertensive heart disease with heart failure: Secondary | ICD-10-CM | POA: Diagnosis not present

## 2024-04-28 DIAGNOSIS — J449 Chronic obstructive pulmonary disease, unspecified: Secondary | ICD-10-CM | POA: Diagnosis not present

## 2024-04-28 DIAGNOSIS — I110 Hypertensive heart disease with heart failure: Secondary | ICD-10-CM | POA: Diagnosis not present

## 2024-04-28 DIAGNOSIS — I080 Rheumatic disorders of both mitral and aortic valves: Secondary | ICD-10-CM | POA: Diagnosis not present

## 2024-04-28 DIAGNOSIS — Z556 Problems related to health literacy: Secondary | ICD-10-CM | POA: Diagnosis not present

## 2024-04-28 DIAGNOSIS — N390 Urinary tract infection, site not specified: Secondary | ICD-10-CM | POA: Diagnosis not present

## 2024-04-28 DIAGNOSIS — I160 Hypertensive urgency: Secondary | ICD-10-CM | POA: Diagnosis not present

## 2024-04-28 DIAGNOSIS — I5031 Acute diastolic (congestive) heart failure: Secondary | ICD-10-CM | POA: Diagnosis not present

## 2024-04-30 DIAGNOSIS — N390 Urinary tract infection, site not specified: Secondary | ICD-10-CM | POA: Diagnosis not present

## 2024-05-01 DIAGNOSIS — F03918 Unspecified dementia, unspecified severity, with other behavioral disturbance: Secondary | ICD-10-CM

## 2024-05-01 DIAGNOSIS — Z556 Problems related to health literacy: Secondary | ICD-10-CM | POA: Diagnosis not present

## 2024-05-01 DIAGNOSIS — F419 Anxiety disorder, unspecified: Secondary | ICD-10-CM | POA: Diagnosis not present

## 2024-05-01 DIAGNOSIS — J449 Chronic obstructive pulmonary disease, unspecified: Secondary | ICD-10-CM | POA: Diagnosis not present

## 2024-05-01 DIAGNOSIS — I509 Heart failure, unspecified: Secondary | ICD-10-CM | POA: Diagnosis not present

## 2024-05-01 DIAGNOSIS — B348 Other viral infections of unspecified site: Secondary | ICD-10-CM | POA: Diagnosis not present

## 2024-05-01 DIAGNOSIS — I160 Hypertensive urgency: Secondary | ICD-10-CM | POA: Diagnosis not present

## 2024-05-01 DIAGNOSIS — I5031 Acute diastolic (congestive) heart failure: Secondary | ICD-10-CM | POA: Diagnosis not present

## 2024-05-01 DIAGNOSIS — N390 Urinary tract infection, site not specified: Secondary | ICD-10-CM | POA: Diagnosis not present

## 2024-05-01 DIAGNOSIS — R451 Restlessness and agitation: Secondary | ICD-10-CM | POA: Diagnosis not present

## 2024-05-01 DIAGNOSIS — I110 Hypertensive heart disease with heart failure: Secondary | ICD-10-CM | POA: Diagnosis not present

## 2024-05-01 DIAGNOSIS — I080 Rheumatic disorders of both mitral and aortic valves: Secondary | ICD-10-CM | POA: Diagnosis not present

## 2024-05-06 DIAGNOSIS — J449 Chronic obstructive pulmonary disease, unspecified: Secondary | ICD-10-CM | POA: Diagnosis not present

## 2024-05-06 DIAGNOSIS — Z556 Problems related to health literacy: Secondary | ICD-10-CM | POA: Diagnosis not present

## 2024-05-06 DIAGNOSIS — I5031 Acute diastolic (congestive) heart failure: Secondary | ICD-10-CM | POA: Diagnosis not present

## 2024-05-06 DIAGNOSIS — B348 Other viral infections of unspecified site: Secondary | ICD-10-CM | POA: Diagnosis not present

## 2024-05-06 DIAGNOSIS — I160 Hypertensive urgency: Secondary | ICD-10-CM | POA: Diagnosis not present

## 2024-05-06 DIAGNOSIS — N390 Urinary tract infection, site not specified: Secondary | ICD-10-CM | POA: Diagnosis not present

## 2024-05-06 DIAGNOSIS — I110 Hypertensive heart disease with heart failure: Secondary | ICD-10-CM | POA: Diagnosis not present

## 2024-05-06 DIAGNOSIS — I080 Rheumatic disorders of both mitral and aortic valves: Secondary | ICD-10-CM | POA: Diagnosis not present

## 2024-05-12 DIAGNOSIS — Z556 Problems related to health literacy: Secondary | ICD-10-CM | POA: Diagnosis not present

## 2024-05-12 DIAGNOSIS — B348 Other viral infections of unspecified site: Secondary | ICD-10-CM | POA: Diagnosis not present

## 2024-05-12 DIAGNOSIS — J449 Chronic obstructive pulmonary disease, unspecified: Secondary | ICD-10-CM | POA: Diagnosis not present

## 2024-05-12 DIAGNOSIS — I110 Hypertensive heart disease with heart failure: Secondary | ICD-10-CM | POA: Diagnosis not present

## 2024-05-12 DIAGNOSIS — I080 Rheumatic disorders of both mitral and aortic valves: Secondary | ICD-10-CM | POA: Diagnosis not present

## 2024-05-12 DIAGNOSIS — I5031 Acute diastolic (congestive) heart failure: Secondary | ICD-10-CM | POA: Diagnosis not present

## 2024-05-12 DIAGNOSIS — I160 Hypertensive urgency: Secondary | ICD-10-CM | POA: Diagnosis not present

## 2024-05-13 ENCOUNTER — Ambulatory Visit: Admitting: Physician Assistant

## 2024-05-21 DIAGNOSIS — I5031 Acute diastolic (congestive) heart failure: Secondary | ICD-10-CM | POA: Diagnosis not present

## 2024-05-22 DIAGNOSIS — Z23 Encounter for immunization: Secondary | ICD-10-CM | POA: Diagnosis not present

## 2024-05-26 DIAGNOSIS — N390 Urinary tract infection, site not specified: Secondary | ICD-10-CM | POA: Diagnosis not present

## 2024-05-29 DIAGNOSIS — H43813 Vitreous degeneration, bilateral: Secondary | ICD-10-CM | POA: Diagnosis not present

## 2024-05-29 DIAGNOSIS — N390 Urinary tract infection, site not specified: Secondary | ICD-10-CM

## 2024-05-29 DIAGNOSIS — Z961 Presence of intraocular lens: Secondary | ICD-10-CM | POA: Diagnosis not present

## 2024-05-29 DIAGNOSIS — Z87440 Personal history of urinary (tract) infections: Secondary | ICD-10-CM

## 2024-05-29 DIAGNOSIS — H353133 Nonexudative age-related macular degeneration, bilateral, advanced atrophic without subfoveal involvement: Secondary | ICD-10-CM | POA: Diagnosis not present

## 2024-05-29 DIAGNOSIS — B9620 Unspecified Escherichia coli [E. coli] as the cause of diseases classified elsewhere: Secondary | ICD-10-CM

## 2024-05-29 DIAGNOSIS — D3132 Benign neoplasm of left choroid: Secondary | ICD-10-CM | POA: Diagnosis not present

## 2024-05-29 DIAGNOSIS — B952 Enterococcus as the cause of diseases classified elsewhere: Secondary | ICD-10-CM

## 2024-05-29 DIAGNOSIS — F03918 Unspecified dementia, unspecified severity, with other behavioral disturbance: Secondary | ICD-10-CM

## 2024-06-18 DIAGNOSIS — N390 Urinary tract infection, site not specified: Secondary | ICD-10-CM | POA: Diagnosis not present

## 2024-07-17 DIAGNOSIS — F0390 Unspecified dementia without behavioral disturbance: Secondary | ICD-10-CM | POA: Diagnosis not present

## 2024-07-17 DIAGNOSIS — L539 Erythematous condition, unspecified: Secondary | ICD-10-CM | POA: Diagnosis not present

## 2024-07-17 DIAGNOSIS — R262 Difficulty in walking, not elsewhere classified: Secondary | ICD-10-CM | POA: Diagnosis not present

## 2024-07-20 DIAGNOSIS — F03918 Unspecified dementia, unspecified severity, with other behavioral disturbance: Secondary | ICD-10-CM | POA: Diagnosis not present

## 2024-07-20 DIAGNOSIS — L989 Disorder of the skin and subcutaneous tissue, unspecified: Secondary | ICD-10-CM | POA: Diagnosis not present

## 2024-08-04 ENCOUNTER — Encounter: Payer: Self-pay | Admitting: Dermatology

## 2024-08-04 ENCOUNTER — Ambulatory Visit: Admitting: Dermatology

## 2024-08-04 VITALS — BP 149/90 | HR 68 | Temp 97.8°F

## 2024-08-04 DIAGNOSIS — C4371 Malignant melanoma of right lower limb, including hip: Secondary | ICD-10-CM | POA: Diagnosis not present

## 2024-08-04 DIAGNOSIS — C439 Malignant melanoma of skin, unspecified: Secondary | ICD-10-CM

## 2024-08-04 DIAGNOSIS — L814 Other melanin hyperpigmentation: Secondary | ICD-10-CM

## 2024-08-04 DIAGNOSIS — L578 Other skin changes due to chronic exposure to nonionizing radiation: Secondary | ICD-10-CM | POA: Diagnosis not present

## 2024-08-04 MED ORDER — MUPIROCIN 2 % EX OINT: 1.0000 | TOPICAL_OINTMENT | Freq: Two times a day (BID) | CUTANEOUS | 5 refills | Status: DC

## 2024-08-04 NOTE — Progress Notes (Signed)
 Follow-Up Visit   Subjective  Stacey Melton is a 89 y.o. female who presents for the following: Mohs for a Malignant Melanoma of right middle plantar surface, biopsied in April 2025. She has had a workup with Dr. Tina in Oncology and has no obvious metastatic spread and would like to proceed with local tumor clearance with Mohs surgery  The following portions of the chart were reviewed this encounter and updated as appropriate: medications, allergies, medical history  Review of Systems:  No other skin or systemic complaints except as noted in HPI or Assessment and Plan.  Objective  Well appearing patient in no apparent distress; mood and affect are within normal limits.  A focused examination was performed of the following areas: Right middle plantar surface Relevant physical exam findings are noted in the Assessment and Plan.   Right Middle Plantar Surface Mottled hyperpigmented brown speckled patch   Assessment & Plan    MELANOMA OF SKIN (HCC) Right Middle Plantar Surface Mohs surgery  Consent obtained: written  Anticoagulation: Was the anticoagulation regimen changed prior to Mohs? No    Anesthesia: Anesthesia method: local infiltration Local anesthetic: lidocaine 1% WITH epi  Procedure Details: Timeout: pre-procedure verification complete Procedure Prep: patient was prepped and draped in usual sterile fashion Prep type: chlorhexidine Biopsy accession number: [id number] Biopsy lab: [facility] Date of biopsy: 12/09/2023 Pre-Op diagnosis: melanoma Melanoma subtype: invasive MohsAIQ Surgical site (if tumor spans multiple areas, please select predominant area): foot (including ankle) Surgery side: right Surgical site (from skin exam): Right Middle Plantar Surface Pre-operative length (cm): 3.5 Pre-operative width (cm): 2.7 Indications for Mohs surgery: anatomic location where tissue conservation is critical, tumor size greater than 2 cm, ill-defined borders  and aggressive histology  Micrographic Surgery Details: Post-operative length (cm): 3.8 Post-operative width (cm): 4 Number of Mohs stages: 1 Post surgery depth of defect: subcutaneous fat  Stage 1    Tumor features identified on Mohs section: no tumor identified  Reconstruction: Was the defect reconstructed?: No    Related Medications mupirocin ointment (BACTROBAN) 2 % Apply 1 Application topically 2 (two) times daily. Apply to foot once to twice a day to keep wound moist   Return in about 1 month (around 09/04/2024) for wound check.  LILLETTE Rollene Gobble, RN, am acting as scribe for RUFUS CHRISTELLA HOLY, MD .   08/04/2024  HISTORY OF PRESENT ILLNESS  Stacey Melton is seen in consultation at the request of Dr. Holy for biopsy-proven Invasive Melanoma of the right plantar surface. They note that the area has been present for about 1 year increasing in size with time.  There is no history of previous treatment.  Reports no other new or changing lesions and has no other complaints today.  Medications and allergies: see patient chart.  Review of systems: Reviewed 8 systems and notable for the above skin cancer.  All other systems reviewed are unremarkable/negative, unless noted in the HPI. Past medical history, surgical history, family history, social history were also reviewed and are noted in the chart/questionnaire.    PHYSICAL EXAMINATION  General: Well-appearing, in no acute distress, alert and oriented x 4. Vitals reviewed in chart (if available).   Skin: Exam reveals a 3.5 x 2.7 cm erythematous papule and biopsy scar on the right plantar surface. There are rhytids, telangiectasias, and lentigines, consistent with photodamage.  Biopsy report(s) reviewed, confirming the diagnosis.   ASSESSMENT  1) Invasive Melanoma of the right plantar surface 2) photodamage 3) solar lentigines   PLAN  1. Due to location, size, histology, or recurrence and the likelihood of subclinical  extension as well as the need to conserve normal surrounding tissue, the patient was deemed acceptable for Mohs micrographic surgery (MMS).  The nature and purpose of the procedure, associated benefits and risks including recurrence and scarring, possible complications such as pain, infection, and bleeding, and alternative methods of treatment if appropriate were discussed with the patient during consent. The lesion location was verified by the patient, by reviewing previous notes, pathology reports, and by photographs as well as angulation measurements if available.  Informed consent was reviewed and signed by the patient, and timeout was performed at 8: 15 AM. See op note below.  2. For the photodamage and solar lentigines, sun protection discussed/information given on OTC sunscreens, and we recommend continued regular follow-up with primary dermatologist every 6 months or sooner for any growing, bleeding, or changing lesions. 3. Prognosis and future surveillance discussed. 4. Letter with treatment outcome sent to referring provider. 5. Pain acetaminophen /ibuprofen  MOHS MICROGRAPHIC SURGERY AND RECONSTRUCTION  Initial size:   3.5 x 2.7 cm Surgical defect/wound size: 3.8 x 4.0 cm Anesthesia:    0.33% lidocaine with 1:200,000 epinephrine EBL:    <5 mL Complications:  None Repair type:   Second Intention  Stages: 1  STAGE I: Anesthesia achieved with 0.5% lidocaine with 1:200,000 epinephrine. ChloraPrep applied. 5 section(s) excised using Mohs technique (this includes total peripheral and deep tissue margin excision and evaluation with frozen sections, excised and interpreted by the same physician). The tumor was first debulked and then excised with an approx. 2mm margin.  Hemostasis was achieved with electrocautery as needed.  The specimen was then oriented, subdivided/relaxed, inked, and processed using Mohs technique.   Tissue was stained with H&E and MART-1 with 1 chromogen.   Frozen section  analysis revealed a clear deep and peripheral margin.   Reconstruction  Patient was notified of results and repair options were discussed, including second intention healing. After reviewing the advantages and disadvantages of each, we agreed on second intention healing as appropriate.   The surgical site was then lightly scrubbed with sterile, saline-soaked gauze.  The area was bandaged using mupirocin ointment, non-adherent gauze, gauze pads, and tape to provide an adequate pressure dressing.   The patient tolerated the procedure well, was given detailed written and verbal wound care instructions, and was discharged in good condition.  The patient will follow-up in 4 weeks and as scheduled with primary dermatologist.    Documentation: I have reviewed the above documentation for accuracy and completeness, and I agree with the above.  RUFUS CHRISTELLA HOLY, MD

## 2024-08-04 NOTE — Patient Instructions (Signed)

## 2024-08-06 ENCOUNTER — Telehealth: Payer: Self-pay

## 2024-08-06 ENCOUNTER — Other Ambulatory Visit: Payer: Self-pay | Admitting: Dermatology

## 2024-08-06 DIAGNOSIS — C439 Malignant melanoma of skin, unspecified: Secondary | ICD-10-CM

## 2024-08-06 MED ORDER — TRAMADOL HCL 50 MG PO TABS: 50.0000 mg | ORAL_TABLET | Freq: Four times a day (QID) | ORAL | 0 refills | Status: AC | PRN

## 2024-08-06 NOTE — Telephone Encounter (Signed)
 Heather from [facility] called and states that the patient is in a lot of pain. Patient is taking Tylenol  for pain. Patient is picking at the bandage and is causing the wound to bleed. They are covering the wound with a non-stick bandage and wrapping with a gauze. Patient has been very active. They are giving her ativan  in the morning to help reduce her irritation.   I encouraged that they apply more gauze to pad her foot to help reduce the pressure on the wound. I discussed that there is concern with prescribing pain medication due to the patient's age, but that I would reach out to the provider for guidance.

## 2024-08-10 ENCOUNTER — Telehealth: Payer: Self-pay

## 2024-08-10 NOTE — Telephone Encounter (Signed)
 Heather called and states that the patient was in pain this morning and there is yellow pus on the inside of it. They would like an order for home health.

## 2024-08-11 NOTE — Telephone Encounter (Signed)
 Called and verifed that home health order should be faxed to [phone] Attn Nursing Staff

## 2024-08-11 NOTE — Telephone Encounter (Signed)
 Order faxed.

## 2024-08-12 DIAGNOSIS — R262 Difficulty in walking, not elsewhere classified: Secondary | ICD-10-CM | POA: Diagnosis not present

## 2024-08-12 DIAGNOSIS — L989 Disorder of the skin and subcutaneous tissue, unspecified: Secondary | ICD-10-CM | POA: Diagnosis not present

## 2024-08-12 DIAGNOSIS — F0390 Unspecified dementia without behavioral disturbance: Secondary | ICD-10-CM | POA: Diagnosis not present

## 2024-08-13 ENCOUNTER — Encounter: Payer: Self-pay | Admitting: Dermatology

## 2024-08-26 ENCOUNTER — Ambulatory Visit: Admitting: Physician Assistant

## 2024-09-08 ENCOUNTER — Encounter: Payer: Self-pay | Admitting: Dermatology

## 2024-09-08 ENCOUNTER — Ambulatory Visit: Admitting: Dermatology

## 2024-09-08 VITALS — BP 128/88

## 2024-09-08 DIAGNOSIS — C439 Malignant melanoma of skin, unspecified: Secondary | ICD-10-CM

## 2024-09-08 DIAGNOSIS — Z85820 Personal history of malignant melanoma of skin: Secondary | ICD-10-CM

## 2024-09-08 DIAGNOSIS — T1490XD Injury, unspecified, subsequent encounter: Secondary | ICD-10-CM

## 2024-09-08 DIAGNOSIS — S91301A Unspecified open wound, right foot, initial encounter: Secondary | ICD-10-CM

## 2024-09-08 MED ORDER — MUPIROCIN 2 % EX OINT: 1.0000 | TOPICAL_OINTMENT | Freq: Two times a day (BID) | CUTANEOUS | 2 refills | Status: AC

## 2024-09-08 NOTE — Patient Instructions (Signed)

## 2024-09-08 NOTE — Progress Notes (Signed)
" ° °  Follow Up Visit   Subjective  Stacey Melton is a 89 y.o. female who presents for the following: follow up from Mohs surgery   The patient presents for follow up from Mohs surgery for an invasive melanoma on the right middle plantar surface, treated on 08/04/2024, healing by second intention. The patient has been bandaging the wound as directed. The endorse the following concerns: none. She is accompanied by her daughter.   The following portions of the chart were reviewed this encounter and updated as appropriate: medications, allergies, medical history  Review of Systems:  No other skin or systemic complaints except as noted in HPI or Assessment and Plan.  Objective  Well appearing patient in no apparent distress; mood and affect are within normal limits.  A focal examination was performed including the right plantar surface. All findings within normal limits unless otherwise noted below.  Healing wound with mild erythema  Relevant physical exam findings are noted in the Assessment and Plan.    Assessment & Plan    Healing Wound s/p Mohs for an invasive melanoma on the right middle plantar surface, treated on 08/04/2024, healing by second intention.  - Reassured that wound is healing well - No evidence of infection - No swelling, induration, purulence, dehiscence, or tenderness out of proportion to the clinical exam, see photo above - Advised to clean wound with Hibiclens and continue mupirocin  daily to wound under a bandage until follow up.   Wound size 3.3 cm x 3.2 cm  HISTORY OF MELANOMA - No evidence of recurrence today - No lymphadenopathy - Recommend regular full body skin exams - Recommend daily broad spectrum sunscreen SPF 30+ to sun-exposed areas, reapply every 2 hours as needed.   Return in about 6 weeks (around 10/20/2024) for Wound Check .  LILLETTE Virgle Boards, Mohs/Dermatology Tech am acting as a neurosurgeon for RUFUS CHRISTELLA HOLY, MD.   Documentation: I have reviewed the  above documentation for accuracy and completeness, and I agree with the above.  RUFUS CHRISTELLA HOLY, MD "

## 2024-10-21 ENCOUNTER — Ambulatory Visit: Admitting: Dermatology
